# Patient Record
Sex: Male | Born: 2000 | Race: Black or African American | Hispanic: No | Marital: Single | Smoking: Never smoker
Health system: Southern US, Community
[De-identification: ages and names within clinical notes are randomized; demographics above are authoritative.]

## PROBLEM LIST (undated history)

## (undated) DIAGNOSIS — F988 Other specified behavioral and emotional disorders with onset usually occurring in childhood and adolescence: Secondary | ICD-10-CM

## (undated) DIAGNOSIS — G4730 Sleep apnea, unspecified: Secondary | ICD-10-CM

## (undated) DIAGNOSIS — F419 Anxiety disorder, unspecified: Secondary | ICD-10-CM

## (undated) DIAGNOSIS — S93316A Dislocation of tarsal joint of unspecified foot, initial encounter: Secondary | ICD-10-CM

## (undated) DIAGNOSIS — T7840XA Allergy, unspecified, initial encounter: Secondary | ICD-10-CM

## (undated) DIAGNOSIS — M6240 Contracture of muscle, unspecified site: Secondary | ICD-10-CM

## (undated) DIAGNOSIS — J45909 Unspecified asthma, uncomplicated: Secondary | ICD-10-CM

## (undated) HISTORY — PX: TONSILLECTOMY AND ADENOIDECTOMY: SHX28

## (undated) HISTORY — DX: Anxiety disorder, unspecified: F41.9

## (undated) HISTORY — DX: Allergy, unspecified, initial encounter: T78.40XA

## (undated) HISTORY — PX: APPENDECTOMY: SHX54

## (undated) HISTORY — DX: Sleep apnea, unspecified: G47.30

---
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
## 2020-01-05 ENCOUNTER — Emergency Department (HOSPITAL_COMMUNITY)
Admission: EM | Admit: 2020-01-05 | Discharge: 2020-01-05 | Disposition: A | Discharge status: Home / Self Care | Payer: Medicaid Other | Attending: Emergency Medicine | Admitting: Emergency Medicine

## 2020-01-05 ENCOUNTER — Emergency Department (HOSPITAL_COMMUNITY): Payer: Medicaid Other

## 2020-01-05 ENCOUNTER — Encounter (HOSPITAL_COMMUNITY): Payer: Self-pay | Admitting: Emergency Medicine

## 2020-01-05 ENCOUNTER — Other Ambulatory Visit: Payer: Self-pay

## 2020-01-05 DIAGNOSIS — Z7951 Long term (current) use of inhaled steroids: Secondary | ICD-10-CM | POA: Diagnosis not present

## 2020-01-05 DIAGNOSIS — M25571 Pain in right ankle and joints of right foot: Secondary | ICD-10-CM | POA: Diagnosis not present

## 2020-01-05 DIAGNOSIS — J45909 Unspecified asthma, uncomplicated: Secondary | ICD-10-CM | POA: Diagnosis not present

## 2020-01-05 NOTE — ED Triage Notes (Signed)
Pt reports R ankle swelling and pain that started tonight. Denies injury, but states that he had surgery on the same ankle over a year ago.

## 2020-01-05 NOTE — Discharge Instructions (Signed)
Tylenol or motrin for pain. Can wear ankle brace for now. If no improvement in the next few days,I would follow-up with your orthopedist. Return here for new concerns.

## 2020-01-05 NOTE — ED Provider Notes (Signed)
[facility]-EMERGENCY DEPT Provider Note   CSN: [id number] Arrival date & time: 01/05/20  0124     History Chief Complaint  Patient presents with  . Ankle Swelling    R    Stephen Bruce is a 19 y.o. male.  The history is provided by the patient and medical records.   19 year old male with history of ADD, developmental delay, asthma, presenting to the ED with right ankle pain.  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  Did have surgery a few years ago try to remedy this.  States his ankle began hurting him today, denies any new injury, trauma, or falls.  States it feels like it is swelling.  Pain worse with weightbearing and ambulation.  No intervention tried prior to arrival.  He is currently established with orthopedics.  Past Medical History:  Diagnosis Date  . Asthma    daily and prn inhalers  . Attention deficit disorder   . 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 ADHD (attention deficit hyperactivity disorder), combined type 07/22/2015  . 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 Learning disability 07/22/2015  . Delayed milestones 10/18/2010  . Speech delays 10/18/2010    Past Surgical History:  Procedure Laterality Date  . ACHILLES TENDON SURGERY Right 08/25/2013   Procedure: TENDON ACHILLES LENGTH RIGHT FOOT;  Surgeon: Alvan Dame, DPM;  Location: [facility];  Service: Podiatry;  Laterality: Right;  . ACHILLES TENDON SURGERY Left 02/04/2014   Procedure: TENDO ACHILLES LENGTH;  Surgeon: Alvan Dame, DPM;  Location: [facility];  Service: Podiatry;  Laterality: Left;  . INCISION AND DRAINAGE ABSCESS ANAL  01/15/2017      . SUBTALAR JOINT ARTHROEREISIS Right 08/25/2013   Procedure: SUBTALAR JOINT ARTHROEREISIS RIGHT;   Surgeon: Alvan Dame, DPM;  Location: [facility];  Service: Podiatry;  Laterality: Right;  . SUBTALAR JOINT ARTHROEREISIS Left 02/04/2014   Procedure: SUBTALAR  ARTHROEREISIS LEFT FOOT ;  Surgeon: Alvan Dame, DPM;  Location: [facility];  Service: Podiatry;  Laterality: Left;  . TONSILLECTOMY AND ADENOIDECTOMY         Family History  Problem Relation Age of Onset  . Diabetes Father   . Hypertension Father   . Asthma Father     Social History   Tobacco Use  . Smoking status: Never Smoker  . Smokeless tobacco: Never Used  Substance Use Topics  . Alcohol use: No  . Drug use: No    Home Medications Prior to Admission medications   Medication Sig Start Date End Date Taking? Authorizing Provider  Misc. Devices MISC New cpap machine on current settings and supplies.  DME: [employer] 12/25/19  Yes [provider]  albuterol (PROAIR HFA) 108 (90 Base) MCG/ACT inhaler Use 2 puffs every four hours as needed for cough or wheeze.  May use 2 puffs 10-20 minutes prior to exercise. 09/20/15   Alfonse Spruce, MD  atomoxetine (STRATTERA) 80 MG capsule Take 1 capsule (80 mg total) by mouth daily. Patient not taking: Reported on 09/20/2015 08/12/15   Nicholos Johns, NP  beclomethasone (QVAR) 80 MCG/ACT inhaler Use 2 puffs twice daily to prevent cough or wheeze.  Rinse gargle, and spit after use. 09/20/15   Alfonse Spruce, MD  clindamycin (CLEOCIN T) 1 % lotion Apply to face once daily for acne 07/15/15   [provider]  fluticasone (FLONASE) 50 MCG/ACT nasal spray USE 1-2 SPRAYS IN EACH NOSTRIL ONCE DAILY FOR STUFFY NOSE OR DRAINAGE. 09/21/15   Alfonse Spruce, MD  fluticasone (FLOVENT HFA) 110 MCG/ACT inhaler Inhale 2 puffs into the lungs 2 (two) times daily. 04/28/16   Alfonse Spruce, MD  fluticasone (FLOVENT HFA) 110 MCG/ACT inhaler Inhale 2 puffs into the lungs 2 (two) times daily. FOR COUGH AND WHEEZE 06/14/16    Alfonse Spruce, MD  ketoconazole (NIZORAL) 2 % cream Apply 1 application topically 2 (two) times daily. 06/05/19   Hyatt, Max T, DPM  loratadine (CLARITIN) 10 MG tablet TAKE 1 TABLET (10 MG TOTAL) BY MOUTH DAILY. 06/30/16   Alfonse Spruce, MD  polyethylene glycol powder [facility]) powder TAKE AS DIRECTED ONCE A DAY ORALLY 09/09/14   [provider]  Spacer/Aero-Holding Chambers (AEROCHAMBER PLUS) inhaler Use as instructed with MDI 10/14/15   Alfonse Spruce, MD  tretinoin (RETIN-A) 0.025 % cream Apply to face once daily for acne 07/15/15   [provider]    Allergies    Shellfish allergy and Shellfish-derived products  Review of Systems   Review of Systems  Musculoskeletal: Positive for arthralgias.  All other systems reviewed and are negative.   Physical Exam Updated Vital Signs BP (!) 161/86 (BP Location: Right Arm)   Pulse 67   Temp 98.3 F (36.8 C) (Oral)   Resp 16   SpO2 98%   Physical Exam Vitals and nursing note reviewed.  Constitutional:      Appearance: He is well-developed.  HENT:     Head: Normocephalic and atraumatic.  Eyes:     Conjunctiva/sclera: Conjunctivae normal.     Pupils: Pupils are equal, round, and reactive to light.  Cardiovascular:     Rate and Rhythm: Normal rate and regular rhythm.     Heart sounds: Normal heart sounds.  Pulmonary:     Effort: Pulmonary effort is normal.     Breath sounds: Normal breath sounds.  Abdominal:     General: Bowel sounds are normal.     Palpations: Abdomen is soft.  Musculoskeletal:        General: Normal range of motion.     Cervical back: Normal range of motion.     Comments: Ankle grossly normal in appearance, trace amount of swelling along lateral aspect; no bony deformity or overlying skin changes, DP pulse intact, moving toes normally, good distal sensation and cap refill  Skin:    General: Skin is warm and dry.  Neurological:     Mental Status: He is alert and  oriented to person, place, and time.     ED Results / Procedures / Treatments   Labs (all labs ordered are listed, but only abnormal results are displayed) Labs Reviewed - No data to display  EKG None  Radiology DG Ankle Complete Right  Result Date: 01/05/2020 CLINICAL DATA:  Right ankle pain EXAM: RIGHT ANKLE - COMPLETE 3+ VIEW COMPARISON:  None. FINDINGS: There is no evidence of fracture, dislocation. Small anterior ankle effusion. Unchanged appearance of surgical screw. There is no evidence of arthropathy or other focal bone abnormality. Soft tissues are unremarkable. IMPRESSION: Small anterior ankle effusion. No fracture or dislocation of the right ankle. Electronically Signed   By: Deatra Robinson M.D.   On: 01/05/2020 02:06    Procedures Procedures (including critical care time)  Medications  Ordered in ED Medications - No data to display  ED Course  I have reviewed the triage vital signs and the nursing notes.  Pertinent labs & imaging results that were available during my care of the patient were reviewed by me and considered in my medical decision making (see chart for details).    MDM Rules/Calculators/A&P    19 y.o. M here with right ankle pain. 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  No gross deformity on exam, mild swelling along lateral aspect.  Foot is NVI.  X-ray with small effusion.  ASO applied, RICE routine encouraged. Will follow-up with orthopedics if any ongoing issues.  Return here for any new/acute changes.  Final Clinical Impression(s) / ED Diagnoses Final diagnoses:  Acute right ankle pain    Rx / DC Orders ED Discharge Orders    None       Garlon Hatchet, PA-C 01/05/20 0251    Paula Libra, MD 01/05/20 [phone]

## 2020-04-20 DIAGNOSIS — Z1331 Encounter for screening for depression: Secondary | ICD-10-CM | POA: Diagnosis not present

## 2020-04-20 DIAGNOSIS — Z0000 Encounter for general adult medical examination without abnormal findings: Secondary | ICD-10-CM | POA: Diagnosis not present

## 2020-04-20 DIAGNOSIS — Z23 Encounter for immunization: Secondary | ICD-10-CM | POA: Diagnosis not present

## 2020-04-20 DIAGNOSIS — Z113 Encounter for screening for infections with a predominantly sexual mode of transmission: Secondary | ICD-10-CM | POA: Diagnosis not present

## 2020-04-20 DIAGNOSIS — G4700 Insomnia, unspecified: Secondary | ICD-10-CM | POA: Diagnosis not present

## 2020-04-20 DIAGNOSIS — F901 Attention-deficit hyperactivity disorder, predominantly hyperactive type: Secondary | ICD-10-CM | POA: Diagnosis not present

## 2020-04-21 DIAGNOSIS — G4721 Circadian rhythm sleep disorder, delayed sleep phase type: Secondary | ICD-10-CM | POA: Diagnosis not present

## 2020-04-21 DIAGNOSIS — R5383 Other fatigue: Secondary | ICD-10-CM | POA: Diagnosis not present

## 2020-04-21 DIAGNOSIS — Z9989 Dependence on other enabling machines and devices: Secondary | ICD-10-CM | POA: Diagnosis not present

## 2020-04-21 DIAGNOSIS — G4733 Obstructive sleep apnea (adult) (pediatric): Secondary | ICD-10-CM | POA: Diagnosis not present

## 2020-04-21 DIAGNOSIS — Z72821 Inadequate sleep hygiene: Secondary | ICD-10-CM | POA: Diagnosis not present

## 2020-09-16 DIAGNOSIS — G4733 Obstructive sleep apnea (adult) (pediatric): Secondary | ICD-10-CM | POA: Diagnosis not present

## 2020-12-25 ENCOUNTER — Encounter (HOSPITAL_COMMUNITY): Payer: Self-pay

## 2020-12-25 ENCOUNTER — Other Ambulatory Visit: Payer: Self-pay

## 2020-12-25 ENCOUNTER — Ambulatory Visit (HOSPITAL_COMMUNITY)
Admission: EM | Admit: 2020-12-25 | Discharge: 2020-12-25 | Disposition: A | Discharge status: Home / Self Care | Payer: Medicaid Other | Attending: Family Medicine | Admitting: Family Medicine

## 2020-12-25 DIAGNOSIS — J069 Acute upper respiratory infection, unspecified: Secondary | ICD-10-CM

## 2020-12-25 DIAGNOSIS — Z20822 Contact with and (suspected) exposure to covid-19: Secondary | ICD-10-CM | POA: Insufficient documentation

## 2020-12-25 MED ORDER — PROMETHAZINE-DM 6.25-15 MG/5ML PO SYRP: 5.0000 mL | ORAL_SOLUTION | Freq: Four times a day (QID) | ORAL | 0 refills | Status: DC | PRN

## 2020-12-25 NOTE — ED Provider Notes (Signed)
[facility]    CSN: [id number] Arrival date & time: 12/25/20  1430      History   Chief Complaint Chief Complaint  Patient presents with   Nasal Congestion    HPI Stephen Bruce is a 20 y.o. male.   Patient presenting today with mom for evaluation of 3-day history of runny nose, sore throat, cough, fatigue.  He denies chest tightness, shortness of breath, chest pain, body aches, fever, chills, abdominal pain, nausea vomiting or diarrhea.  Has not tried anything over-the-counter for symptoms but is compliant with his asthma and allergy regimen.  No known sick contacts recently.   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   Learning disability 07/22/2015   Delayed milestones 10/18/2010   Speech delays 10/18/2010    Past Surgical History:  Procedure Laterality Date   ACHILLES TENDON SURGERY Right 08/25/2013   Procedure: TENDON ACHILLES LENGTH RIGHT FOOT;  Surgeon: Alvan Dame, DPM;  Location: [facility];  Service: Podiatry;  Laterality: Right;   ACHILLES TENDON SURGERY Left 02/04/2014   Procedure: TENDO ACHILLES LENGTH;  Surgeon: Alvan Dame, DPM;  Location: [facility];  Service: Podiatry;  Laterality: Left;   INCISION AND DRAINAGE ABSCESS ANAL  01/15/2017       SUBTALAR JOINT ARTHROEREISIS Right 08/25/2013   Procedure: SUBTALAR JOINT ARTHROEREISIS RIGHT;  Surgeon: Alvan Dame, DPM;  Location: [facility];  Service: Podiatry;  Laterality: Right;   SUBTALAR JOINT ARTHROEREISIS Left 02/04/2014   Procedure: SUBTALAR  ARTHROEREISIS LEFT FOOT ;  Surgeon: Alvan Dame, DPM;  Location: [facility];  Service: Podiatry;  Laterality: Left;   TONSILLECTOMY AND ADENOIDECTOMY         Home Medications    Prior to Admission medications   Medication Sig Start Date End Date Taking? Authorizing Provider  promethazine-dextromethorphan (PROMETHAZINE-DM) 6.25-15 MG/5ML syrup Take 5 mLs by mouth 4 (four) times daily as needed for cough. 12/25/20  Yes Particia Nearing, PA-C  albuterol [facility]) [phone] Base) MCG/ACT inhaler Use 2 puffs every four hours as needed for cough or wheeze.  May use 2 puffs 10-20 minutes prior to exercise. 09/20/15   Alfonse Spruce, MD  atomoxetine (STRATTERA) 80 MG capsule Take 1 capsule (80 mg total) by mouth daily. Patient not taking: Reported on 09/20/2015 08/12/15   Nicholos Johns, NP  beclomethasone (QVAR) 80 MCG/ACT inhaler Use 2 puffs twice daily to prevent cough or wheeze.  Rinse gargle, and spit after use. 09/20/15   Alfonse Spruce, MD  clindamycin (CLEOCIN T) 1 % lotion Apply to face once daily for acne 07/15/15   [provider]  fluticasone (FLONASE) 50 MCG/ACT nasal spray USE 1-2 SPRAYS IN EACH NOSTRIL ONCE DAILY FOR STUFFY NOSE OR DRAINAGE. 09/21/15   Alfonse Spruce, MD  fluticasone (FLOVENT HFA) 110 MCG/ACT inhaler Inhale 2 puffs into the lungs 2 (two) times daily. 04/28/16   Alfonse Spruce, MD  fluticasone (FLOVENT HFA) 110 MCG/ACT inhaler Inhale 2 puffs into the lungs 2 (two) times daily. FOR COUGH AND WHEEZE 06/14/16  Alfonse Spruce, MD  ketoconazole (NIZORAL) 2 % cream Apply 1 application topically 2 (two) times daily. 06/05/19   Hyatt, Max T, DPM  loratadine (CLARITIN) 10 MG tablet TAKE 1 TABLET (10 MG TOTAL) BY MOUTH DAILY. 06/30/16   Alfonse Spruce, MD  Misc. Devices MISC New cpap machine on current settings and supplies.  DME: [employer] 12/25/19   [provider]  polyethylene glycol powder (GLYCOLAX/MIRALAX) powder TAKE AS DIRECTED ONCE A DAY  ORALLY 09/09/14   [provider]  Spacer/Aero-Holding Chambers (AEROCHAMBER PLUS) inhaler Use as instructed with MDI 10/14/15   Alfonse Spruce, MD  tretinoin (RETIN-A) 0.025 % cream Apply to face once daily for acne 07/15/15   [provider]    Family History Family History  Problem Relation Age of Onset   Diabetes Father    Hypertension Father    Asthma Father     Social History Social History   Tobacco Use   Smoking status: Never   Smokeless tobacco: Never  Substance Use Topics   Alcohol use: No   Drug use: No     Allergies   Shellfish allergy and Shellfish-derived products   Review of Systems Review of Systems Per HPI  Physical Exam Triage Vital Signs ED Triage Vitals  Enc Vitals Group     BP 12/25/20 1610 133/86     Pulse Rate 12/25/20 1610 80     Resp 12/25/20 1610 18     Temp 12/25/20 1610 98.5 F (36.9 C)     Temp Source 12/25/20 1610 Oral     SpO2 12/25/20 1610 97 %     Weight --      Height --      Head Circumference --      Peak Flow --      Pain Score 12/25/20 1609 0     Pain Loc --      Pain Edu? --      Excl. in GC? --    No data found.  Updated Vital Signs BP 133/86 (BP Location: Right Arm)   Pulse 80   Temp 98.5 F (36.9 C) (Oral)   Resp 18   SpO2 97%   Visual Acuity Right Eye Distance:   Left Eye Distance:   Bilateral Distance:    Right Eye Near:   Left Eye Near:    Bilateral Near:     Physical Exam Vitals and nursing note reviewed.  Constitutional:      Appearance: Normal appearance.  HENT:     Head: Atraumatic.     Right Ear: Tympanic membrane normal.     Left Ear: Tympanic membrane normal.     Nose: Rhinorrhea present.     Mouth/Throat:     Mouth: Mucous membranes are moist.     Pharynx: Oropharynx is clear. Posterior oropharyngeal erythema present. No oropharyngeal exudate.  Eyes:     Extraocular Movements: Extraocular movements intact.     Conjunctiva/sclera: Conjunctivae normal.   Cardiovascular:     Rate and Rhythm: Normal rate and regular rhythm.  Pulmonary:     Effort: Pulmonary effort is normal. No respiratory distress.     Breath sounds: Normal breath sounds. No wheezing or rales.  Musculoskeletal:        General: Normal range of motion.     Cervical back: Normal range of motion and neck supple.  Skin:    General: Skin is warm and dry.  Neurological:     General: No focal deficit  present.     Mental Status: Mental status is at baseline.  Psychiatric:        Mood and Affect: Mood normal.        Thought Content: Thought content normal.        Judgment: Judgment normal.     UC Treatments / Results  Labs (all labs ordered are listed, but only abnormal results are displayed) Labs Reviewed  SARS CORONAVIRUS 2 (TAT 6-24 HRS)  RESPIRATORY PANEL BY PCR    EKG   Radiology No results found.  Procedures Procedures (including critical care time)  Medications Ordered in UC Medications - No data to display  Initial Impression / Assessment and Plan / UC Course  I have reviewed the triage vital signs and the nursing notes.  Pertinent labs & imaging results that were available during my care of the patient were reviewed by me and considered in my medical decision making (see chart for details).     Vitals and exam reassuring, suspect viral illness causing symptoms.  COVID and respiratory panel pending, discussed Phenergan DM, DayQuil, NyQuil, continued asthma and allergy regimen.  Return for acutely worsening symptoms.  Final Clinical Impressions(s) / UC Diagnoses   Final diagnoses:  Viral URI with cough   Discharge Instructions   None    ED Prescriptions     Medication Sig Dispense Auth. Provider   promethazine-dextromethorphan (PROMETHAZINE-DM) 6.25-15 MG/5ML syrup Take 5 mLs by mouth 4 (four) times daily as needed for cough. 100 mL Particia Nearing, [place]      PDMP not reviewed this encounter.   Particia Nearing,  [place] 12/25/20 1639

## 2020-12-25 NOTE — ED Triage Notes (Signed)
Pt presents with runny nose x 3 days. States his nose has been bleeding and c/o cough.

## 2020-12-26 LAB — SARS CORONAVIRUS 2 (TAT 6-24 HRS): SARS Coronavirus 2: NEGATIVE

## 2020-12-28 DIAGNOSIS — J069 Acute upper respiratory infection, unspecified: Secondary | ICD-10-CM | POA: Diagnosis not present

## 2020-12-29 LAB — RESPIRATORY PANEL BY PCR

## 2021-04-21 DIAGNOSIS — G4733 Obstructive sleep apnea (adult) (pediatric): Secondary | ICD-10-CM | POA: Diagnosis not present

## 2021-06-28 DIAGNOSIS — Z6828 Body mass index (BMI) 28.0-28.9, adult: Secondary | ICD-10-CM | POA: Diagnosis not present

## 2021-06-28 DIAGNOSIS — Z0000 Encounter for general adult medical examination without abnormal findings: Secondary | ICD-10-CM | POA: Diagnosis not present

## 2021-06-28 DIAGNOSIS — Z113 Encounter for screening for infections with a predominantly sexual mode of transmission: Secondary | ICD-10-CM | POA: Diagnosis not present

## 2021-06-28 DIAGNOSIS — Z1331 Encounter for screening for depression: Secondary | ICD-10-CM | POA: Diagnosis not present

## 2021-06-28 DIAGNOSIS — Z713 Dietary counseling and surveillance: Secondary | ICD-10-CM | POA: Diagnosis not present

## 2021-07-08 DIAGNOSIS — G4733 Obstructive sleep apnea (adult) (pediatric): Secondary | ICD-10-CM | POA: Diagnosis not present

## 2021-07-13 DIAGNOSIS — L0889 Other specified local infections of the skin and subcutaneous tissue: Secondary | ICD-10-CM | POA: Diagnosis not present

## 2021-08-25 DIAGNOSIS — G4733 Obstructive sleep apnea (adult) (pediatric): Secondary | ICD-10-CM | POA: Diagnosis not present

## 2022-01-02 IMAGING — CR DG ANKLE COMPLETE 3+V*R*
3 series · 3 of 3 positions shown · non-contrast
Comparison: None.

CLINICAL DATA: Right ankle pain

EXAM:
RIGHT ANKLE - COMPLETE 3+ VIEW

[x ankle ap right]
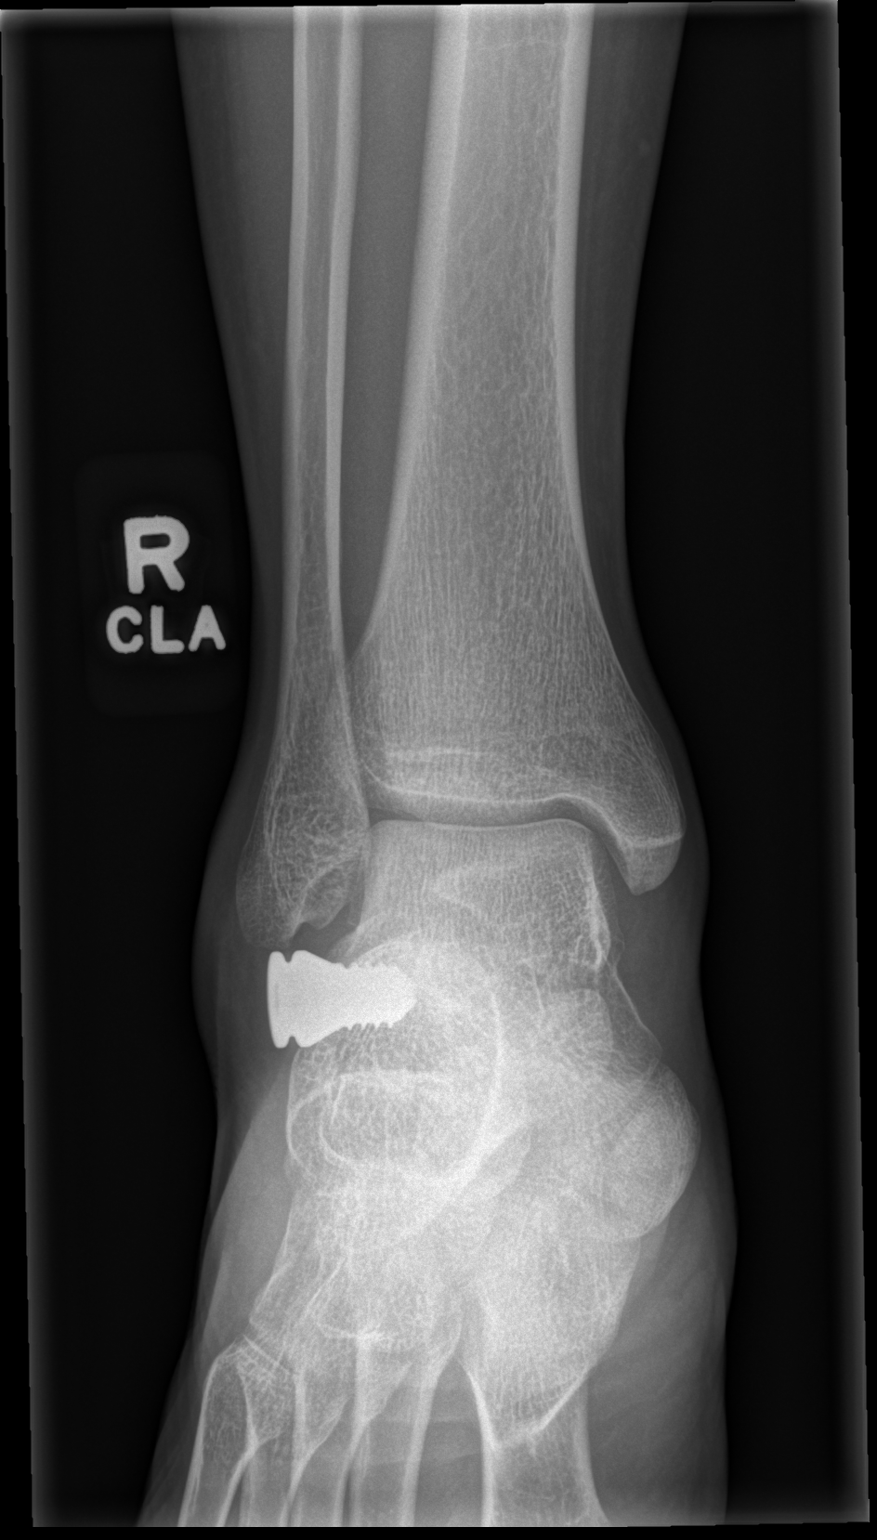

[x ankle obl right]
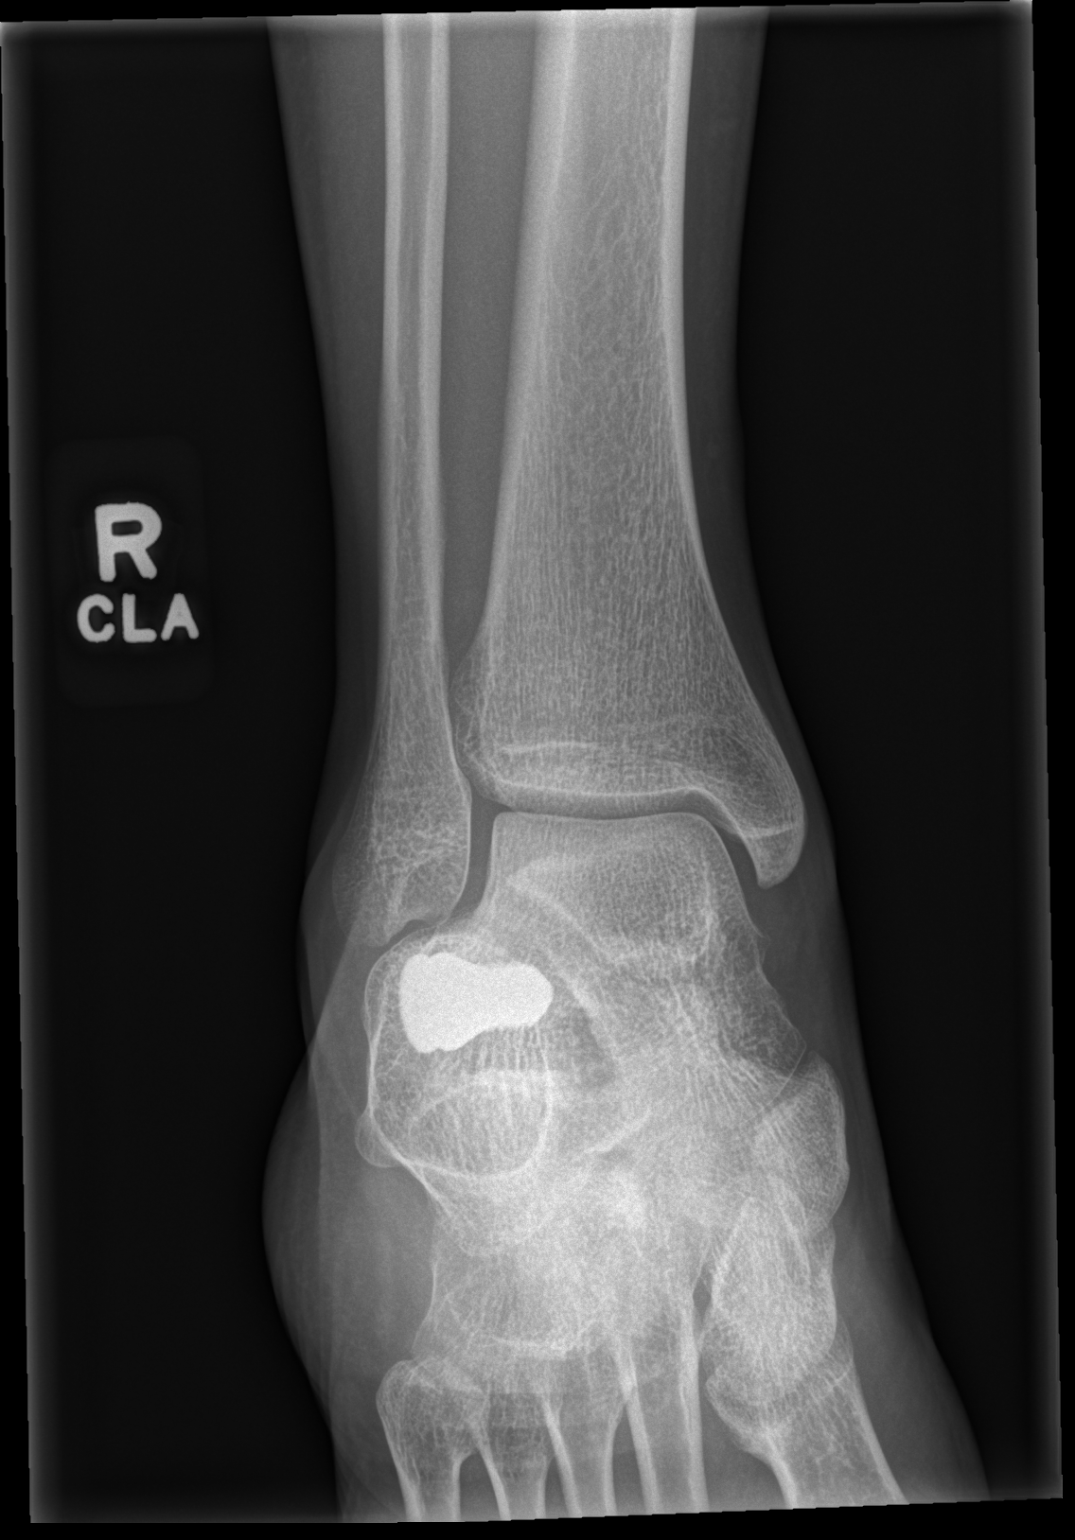

[x ankle lat right]
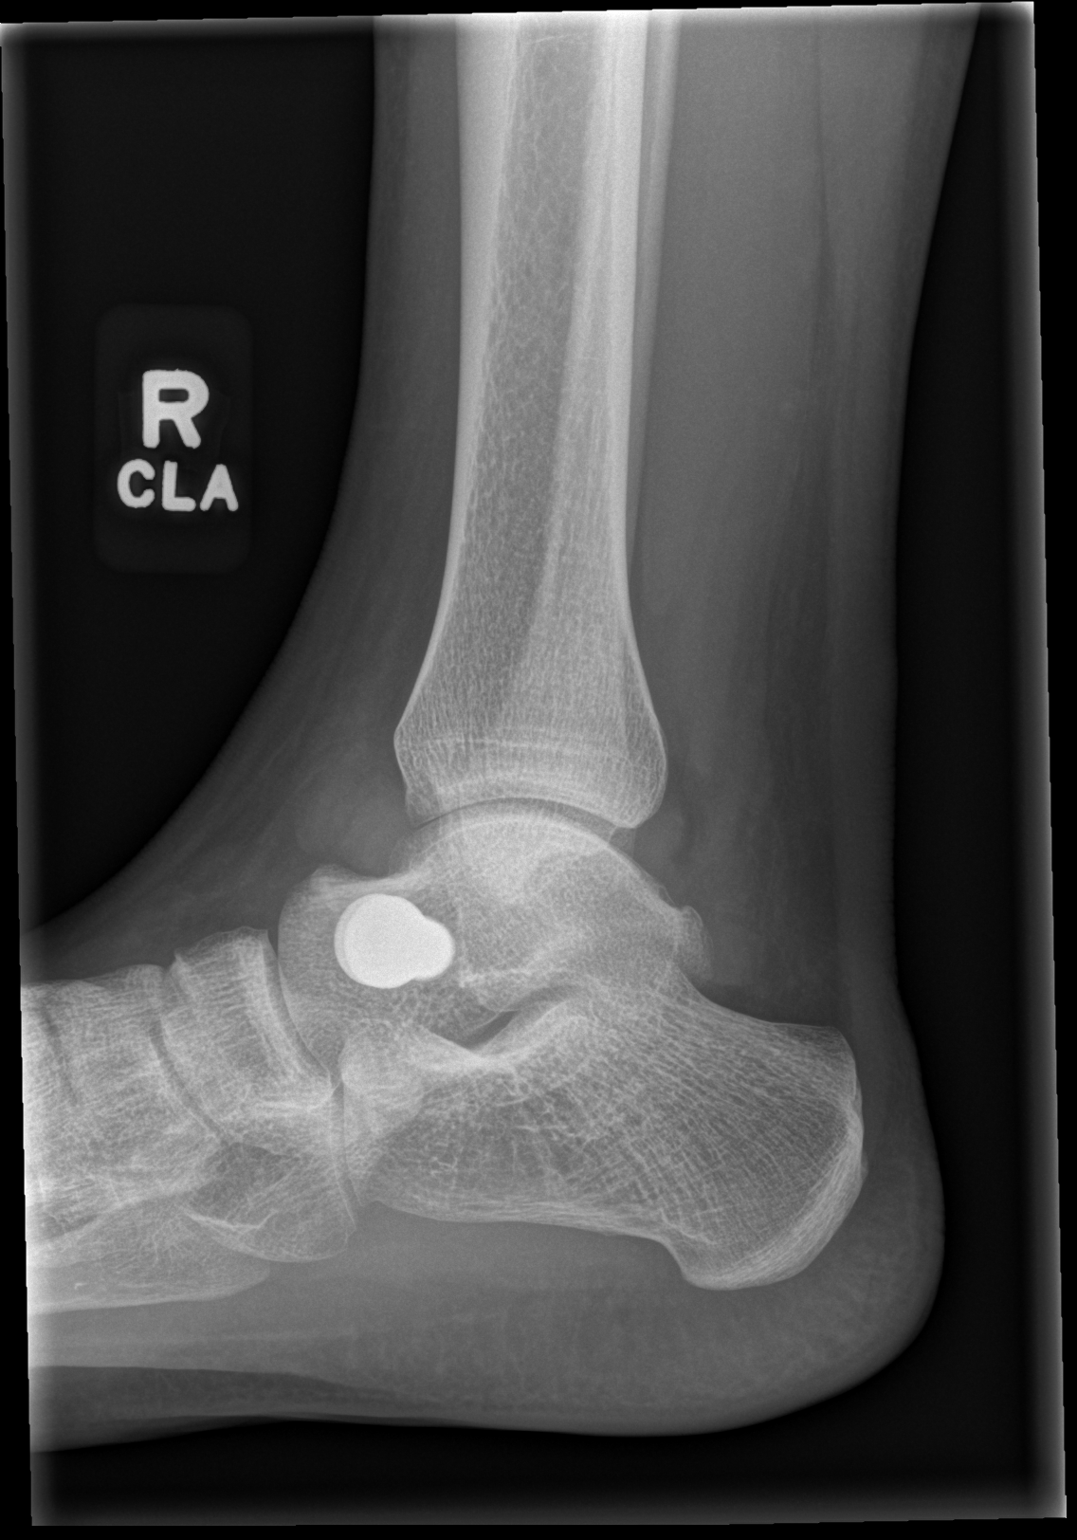

[3 of 3 positions shown; findings below may reference images not displayed]

FINDINGS: There is no evidence of fracture, dislocation. Small anterior ankle
effusion. Unchanged appearance of surgical screw. There is no
evidence of arthropathy or other focal bone abnormality. Soft
tissues are unremarkable.
IMPRESSION: Small anterior ankle effusion. No fracture or dislocation of the
right ankle.

## 2022-01-04 DIAGNOSIS — G4733 Obstructive sleep apnea (adult) (pediatric): Secondary | ICD-10-CM | POA: Diagnosis not present

## 2022-01-31 ENCOUNTER — Ambulatory Visit (HOSPITAL_COMMUNITY)
Admission: EM | Admit: 2022-01-31 | Discharge: 2022-01-31 | Disposition: A | Discharge status: Home / Self Care | Payer: Medicaid Other | Attending: Emergency Medicine | Admitting: Emergency Medicine

## 2022-01-31 ENCOUNTER — Encounter (HOSPITAL_COMMUNITY): Payer: Self-pay

## 2022-01-31 DIAGNOSIS — B349 Viral infection, unspecified: Secondary | ICD-10-CM | POA: Diagnosis not present

## 2022-01-31 DIAGNOSIS — Z1152 Encounter for screening for COVID-19: Secondary | ICD-10-CM | POA: Diagnosis not present

## 2022-01-31 LAB — RESP PANEL BY RT-PCR (FLU A&B, COVID) ARPGX2
Influenza A by PCR: NEGATIVE
Influenza B by PCR: NEGATIVE
SARS Coronavirus 2 by RT PCR: NEGATIVE

## 2022-01-31 MED ORDER — IBUPROFEN 600 MG PO TABS: 600.0000 mg | ORAL_TABLET | Freq: Four times a day (QID) | ORAL | 0 refills | Status: AC | PRN

## 2022-01-31 MED ORDER — GUAIFENESIN ER 600 MG PO TB12: 600.0000 mg | ORAL_TABLET | Freq: Two times a day (BID) | ORAL | 0 refills | Status: AC

## 2022-01-31 NOTE — ED Triage Notes (Signed)
Pt c/o headache/cough/runny nose/chills x4 days. States taking OTC meds with little relief.

## 2022-01-31 NOTE — ED Provider Notes (Signed)
[facility]    CSN: [id number] Arrival date & time: 01/31/22  1605     History   Chief Complaint Chief Complaint  Patient presents with   Cough    HPI Stephen Bruce is a 21 y.o. male.  Reports 4 day history of runny nose and congestion, body aches, some chills No fevers. No GI symptoms. 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   Learning disability 07/22/2015   Delayed milestones 10/18/2010   Speech delays 10/18/2010    Past Surgical History:  Procedure Laterality Date   ACHILLES TENDON SURGERY Right 08/25/2013   Procedure: TENDON ACHILLES LENGTH RIGHT FOOT;  Surgeon: Alvan Dame, DPM;  Location: [facility];  Service: Podiatry;  Laterality: Right;   ACHILLES TENDON SURGERY Left 02/04/2014   Procedure: TENDO ACHILLES LENGTH;  Surgeon: Alvan Dame, DPM;  Location: [facility];  Service: Podiatry;  Laterality: Left;   INCISION AND DRAINAGE ABSCESS ANAL  01/15/2017       SUBTALAR JOINT ARTHROEREISIS Right 08/25/2013   Procedure: SUBTALAR JOINT ARTHROEREISIS RIGHT;  Surgeon: Alvan Dame, DPM;  Location: [facility];  Service: Podiatry;  Laterality: Right;   SUBTALAR JOINT ARTHROEREISIS Left 02/04/2014   Procedure: SUBTALAR  ARTHROEREISIS LEFT FOOT ;  Surgeon: Alvan Dame, DPM;  Location: [facility];  Service: Podiatry;  Laterality: Left;   TONSILLECTOMY AND ADENOIDECTOMY      Home Medications    Prior to Admission medications    Medication Sig Start Date End Date Taking? Authorizing Provider  guaiFENesin (MUCINEX) 600 MG 12 hr tablet Take 1 tablet (600 mg total) by mouth 2 (two) times daily for 5 days. 01/31/22 02/05/22 Yes Tresia Revolorio, Lurena Joiner, PA-C  ibuprofen (ADVIL) 600 MG tablet Take 1 tablet (600 mg total) by mouth every 6 (six) hours as needed. 01/31/22  Yes Jsaon Yoo, Lurena Joiner, PA-C  albuterol (PROAIR HFA) 108 (90 Base) MCG/ACT inhaler Use 2 puffs every four hours as needed for cough or wheeze.  May use 2 puffs 10-20 minutes prior to exercise. 09/20/15   Alfonse Spruce, MD  atomoxetine (STRATTERA) 80 MG capsule Take 1 capsule (80 mg total) by mouth daily. Patient not taking: Reported on 09/20/2015 08/12/15   Nicholos Johns, NP  beclomethasone (QVAR) 80 MCG/ACT inhaler Use 2 puffs twice daily to prevent cough or wheeze.  Rinse gargle, and spit after use. 09/20/15   Alfonse Spruce, MD  clindamycin (CLEOCIN T) 1 % lotion Apply to face once daily for acne 07/15/15   [provider]  fluticasone (FLONASE) 50 MCG/ACT nasal spray USE 1-2 SPRAYS IN EACH NOSTRIL ONCE DAILY FOR STUFFY NOSE OR DRAINAGE. 09/21/15   Alfonse Spruce, MD  fluticasone (FLOVENT HFA) 110 MCG/ACT inhaler Inhale 2 puffs into the lungs 2 (two) times daily. 04/28/16   Alfonse Spruce, MD  fluticasone (FLOVENT HFA) 110 MCG/ACT inhaler Inhale 2 puffs into the lungs 2 (two) times daily. FOR COUGH AND WHEEZE 06/14/16  Valentina Shaggy, MD  ketoconazole (NIZORAL) 2 % cream Apply 1 application topically 2 (two) times daily. 06/05/19   Hyatt, Max T, DPM  loratadine (CLARITIN) 10 MG tablet TAKE 1 TABLET (10 MG TOTAL) BY MOUTH DAILY. 06/30/16   Valentina Shaggy, MD  Misc. Devices MISC New cpap machine on current settings and supplies.  DME: [facility] 12/25/19   [provider]  polyethylene glycol powder (GLYCOLAX/MIRALAX) powder TAKE AS DIRECTED ONCE A DAY ORALLY 09/09/14   [provider]   promethazine-dextromethorphan (PROMETHAZINE-DM) 6.25-15 MG/5ML syrup Take 5 mLs by mouth 4 (four) times daily as needed for cough. 12/25/20   Volney American, PA-C  Spacer/Aero-Holding Chambers (AEROCHAMBER PLUS) inhaler Use as instructed with MDI 10/14/15   Valentina Shaggy, MD  tretinoin (RETIN-A) 0.025 % cream Apply to face once daily for acne 07/15/15   [provider]    Family History Family History  Problem Relation Age of Onset   Diabetes Father    Hypertension Father    Asthma Father     Social History Social History   Tobacco Use   Smoking status: Never   Smokeless tobacco: Never  Substance Use Topics   Alcohol use: No   Drug use: No     Allergies   Shellfish allergy and Shellfish-derived products   Review of Systems Review of Systems  Per HPI  Physical Exam Triage Vital Signs ED Triage Vitals  Enc Vitals Group     BP      Pulse      Resp      Temp      Temp src      SpO2      Weight      Height      Head Circumference      Peak Flow      Pain Score      Pain Loc      Pain Edu?      Excl. in [place]?    No data found.  Updated Vital Signs BP 117/80 (BP Location: Left Arm)   Pulse 68   Temp 98.4 F (36.9 C) (Oral)   Resp 18   SpO2 98%    Physical Exam Vitals and nursing note reviewed.  Constitutional:      General: He is not in acute distress. HENT:     Nose: No rhinorrhea.     Mouth/Throat:     Mouth: Mucous membranes are moist.     Pharynx: Oropharynx is clear. Uvula midline. No posterior oropharyngeal erythema.     Tonsils: No tonsillar exudate or tonsillar abscesses.  Eyes:     Conjunctiva/sclera: Conjunctivae normal.  Cardiovascular:     Rate and Rhythm: Normal rate and regular rhythm.     Pulses: Normal pulses.     Heart sounds: Normal heart sounds.  Pulmonary:     Effort: Pulmonary effort is normal. No respiratory distress.     Breath sounds: Normal breath sounds.  Abdominal:     Palpations: Abdomen is  soft.     Tenderness: There is no abdominal tenderness. There is no guarding.  Musculoskeletal:     Cervical back: Normal range of motion.  Lymphadenopathy:     Cervical: No cervical adenopathy.  Skin:    General: Skin is warm and dry.  Neurological:     Mental Status: He is alert and oriented to person, place, and time.    UC Treatments / Results  Labs (all labs ordered are  listed, but only abnormal results are displayed) Labs Reviewed  RESP PANEL BY RT-PCR (FLU A&B, COVID) [id number]    EKG  Radiology No results found.  Procedures Procedures   Medications Ordered in UC Medications - No data to display  Initial Impression / Assessment and Plan / UC Course  I have reviewed the triage vital signs and the nursing notes.  Pertinent labs & imaging results that were available during my care of the patient were reviewed by me and considered in my medical decision making (see chart for details).  Afebrile here Likely viral illness Mom would like covid/flu test, currently pending. Discussed out of window for flu antivirals, if covid positive consider Paxlovid although he is not high risk. Patient preference based on results. No recent GFR but he is young and healthy. Symptomatic care - try mucinex for congestion, tylenol and ibuprofen alternated for aches/fever, increase fluids Work note provided Return precautions discussed. Patient and mom agree to plan  Final Clinical Impressions(s) / UC Diagnoses   Final diagnoses:  Viral illness     Discharge Instructions      You can alternate tylenol and ibuprofen every 4-6 hours for pain/fever Mucinex twice daily can help with congestion, if taken with lots of fluids! Symptoms should improve over the next several days.  We will call you if your covid/flu test returns positive.       ED Prescriptions     Medication Sig Dispense Auth. Provider   guaiFENesin (MUCINEX) 600 MG 12 hr tablet Take 1 tablet (600 mg total) by mouth  2 (two) times daily for 5 days. 10 tablet Drea Jurewicz, PA-C   ibuprofen (ADVIL) 600 MG tablet Take 1 tablet (600 mg total) by mouth every 6 (six) hours as needed. 30 tablet Libni Fusaro, Wells Guiles, PA-C      PDMP not reviewed this encounter.   Sakiya Stepka, Wells Guiles, Hershal Coria 01/31/22 2030

## 2022-01-31 NOTE — Discharge Instructions (Addendum)
You can alternate tylenol and ibuprofen every 4-6 hours for pain/fever Mucinex twice daily can help with congestion, if taken with lots of fluids! Symptoms should improve over the next several days.  We will call you if your covid/flu test returns positive.

## 2022-03-26 ENCOUNTER — Encounter (HOSPITAL_COMMUNITY): Payer: Self-pay | Admitting: *Deleted

## 2022-03-26 ENCOUNTER — Ambulatory Visit (HOSPITAL_COMMUNITY)
Admission: EM | Admit: 2022-03-26 | Discharge: 2022-03-26 | Disposition: A | Discharge status: Home / Self Care | Payer: Medicaid Other | Attending: Physician Assistant | Admitting: Physician Assistant

## 2022-03-26 DIAGNOSIS — L739 Follicular disorder, unspecified: Secondary | ICD-10-CM

## 2022-03-26 MED ORDER — DOXYCYCLINE HYCLATE 100 MG PO CAPS: 100.0000 mg | ORAL_CAPSULE | Freq: Two times a day (BID) | ORAL | 0 refills | Status: DC

## 2022-03-26 MED ORDER — CLINDAMYCIN PHOSPHATE 1 % EX LOTN: TOPICAL_LOTION | Freq: Two times a day (BID) | CUTANEOUS | 0 refills | Status: DC

## 2022-03-26 NOTE — Discharge Instructions (Addendum)
Internal referral has been made to [facility].  Their office should be calling you within the next 2 to 3 days to arrange appointment to be seen and evaluated.  Advised take doxycycline 100 mg every 12 hours to treat the scalp skin infection which is also referred to as a folliculitis.  Also use the lotion and apply to the irritated areas twice daily as this will also help resolve the irritation.  Advised follow-up PCP or return as needed.

## 2022-03-26 NOTE — ED Triage Notes (Signed)
Per mother, pt had same sxs last yr -- was placed on couple different abx and referred to dermatology, but never got an appt. C/O "sores to head", which cause small areas of alopecia; started with area of swelling to right scalp -- mother states she noticed swelling 2 days ago.

## 2022-03-26 NOTE — ED Provider Notes (Signed)
[facility]    CSN: [id number] Arrival date & time: 03/26/22  1337      History   Chief Complaint No chief complaint on file.   HPI Stephen Bruce is a 22 y.o. male.   22 year old male presents with sores in scalp.  Patient's mother indicates that over the past couple years he has been having intermittent sores in the scalp.  She relates that the sores will erupt in several different places, slightly painful, sometimes they will drain.  She relates when they resolve there is hair loss around the areas and it takes a couple months for the hair to grow back.  She indicates that last time this occurred was a year ago and he was placed on antibiotic and this tended to improve the condition.  She indicates he was given a referral to dermatology but she was not able to get and at that time to have them evaluated.  She indicates the worst area always on the right temporal side of the scalp to where there is a fairly large area that tends to erupt intermittently.  She indicates sometimes that area will tend to drain and then it resolves only to return.  She relates she has not had any trauma to the scalp, no fever or chills.  She relates she does use head and shoulder shampoo on a regular basis to try to prevent the areas from developing.     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   Learning disability 07/22/2015   Delayed milestones 10/18/2010   Speech delays 10/18/2010    Past Surgical History:  Procedure Laterality Date   ACHILLES TENDON SURGERY Right 08/25/2013   Procedure: TENDON ACHILLES LENGTH RIGHT FOOT;  Surgeon:  Harriet Masson, DPM;  Location: [facility];  Service: Podiatry;  Laterality: Right;   ACHILLES TENDON SURGERY Left 02/04/2014   Procedure: TENDO ACHILLES LENGTH;  Surgeon: Harriet Masson, DPM;  Location: [facility];  Service: Podiatry;  Laterality: Left;   INCISION AND DRAINAGE ABSCESS ANAL  01/15/2017       SUBTALAR JOINT ARTHROEREISIS Right 08/25/2013   Procedure: SUBTALAR JOINT ARTHROEREISIS RIGHT;  Surgeon: Harriet Masson, DPM;  Location: [facility];  Service: Podiatry;  Laterality: Right;   SUBTALAR JOINT ARTHROEREISIS Left 02/04/2014   Procedure: SUBTALAR  ARTHROEREISIS LEFT FOOT ;  Surgeon: Harriet Masson, DPM;  Location: [facility];  Service: Podiatry;  Laterality: Left;   TONSILLECTOMY AND ADENOIDECTOMY         Home Medications    Prior to Admission medications   Medication Sig Start Date End Date Taking? Authorizing Provider  clindamycin (CLEOCIN T) 1 % lotion Apply topically 2 (two) times daily. 03/26/22  Yes Nyoka Lint, PA-C  doxycycline (VIBRAMYCIN) 100 MG capsule Take 1 capsule (100 mg total) by mouth 2 (two) times daily. 03/26/22  Yes Nyoka Lint, PA-C  albuterol [facility]) [phone] Base) MCG/ACT inhaler Use 2 puffs every four hours as needed for cough or wheeze.  May use 2 puffs 10-20 minutes prior to exercise. 09/20/15   Salvatore Marvel  Louis, MD  atomoxetine (STRATTERA) 80 MG capsule Take 1 capsule (80 mg total) by mouth daily. Patient not taking: Reported on 09/20/2015 08/12/15   Gery Pray, NP  beclomethasone (QVAR) 80 MCG/ACT inhaler Use 2 puffs twice daily to prevent cough or wheeze.  Rinse gargle, and spit after use. 09/20/15   Valentina Shaggy, MD  clindamycin (CLEOCIN T) 1 % lotion Apply to face once daily for acne 07/15/15   [provider]  fluticasone (FLONASE) 50 MCG/ACT nasal spray USE 1-2 SPRAYS IN EACH NOSTRIL ONCE DAILY FOR STUFFY NOSE OR DRAINAGE. 09/21/15   Valentina Shaggy, MD   fluticasone (FLOVENT HFA) 110 MCG/ACT inhaler Inhale 2 puffs into the lungs 2 (two) times daily. 04/28/16   Valentina Shaggy, MD  fluticasone (FLOVENT HFA) 110 MCG/ACT inhaler Inhale 2 puffs into the lungs 2 (two) times daily. FOR COUGH AND WHEEZE 06/14/16   Valentina Shaggy, MD  ibuprofen (ADVIL) 600 MG tablet Take 1 tablet (600 mg total) by mouth every 6 (six) hours as needed. 01/31/22   Rising, Wells Guiles, PA-C  ketoconazole (NIZORAL) 2 % cream Apply 1 application topically 2 (two) times daily. 06/05/19   Hyatt, Max T, DPM  loratadine (CLARITIN) 10 MG tablet TAKE 1 TABLET (10 MG TOTAL) BY MOUTH DAILY. 06/30/16   Valentina Shaggy, MD  Misc. Devices MISC New cpap machine on current settings and supplies.  DME: [facility] 12/25/19   [provider]  polyethylene glycol powder (GLYCOLAX/MIRALAX) powder TAKE AS DIRECTED ONCE A DAY ORALLY 09/09/14   [provider]  promethazine-dextromethorphan (PROMETHAZINE-DM) 6.25-15 MG/5ML syrup Take 5 mLs by mouth 4 (four) times daily as needed for cough. 12/25/20   Volney American, PA-C  Spacer/Aero-Holding Chambers (AEROCHAMBER PLUS) inhaler Use as instructed with MDI 10/14/15   Valentina Shaggy, MD  tretinoin (RETIN-A) 0.025 % cream Apply to face once daily for acne 07/15/15   [provider]    Family History Family History  Problem Relation Age of Onset   Diabetes Father    Hypertension Father    Asthma Father     Social History Social History   Tobacco Use   Smoking status: Never   Smokeless tobacco: Never  Vaping Use   Vaping Use: Never used  Substance Use Topics   Alcohol use: No   Drug use: Never     Allergies   Shellfish allergy and Shellfish-derived products   Review of Systems Review of Systems  Skin:  Positive for rash (scalp sores, pustular eruptions).     Physical Exam Triage Vital Signs ED Triage Vitals  Enc Vitals Group     BP 03/26/22 1439 111/71     Pulse Rate  03/26/22 1439 77     Resp 03/26/22 1439 16     Temp 03/26/22 1439 98.2 F (36.8 C)     Temp Source 03/26/22 1439 Oral     SpO2 03/26/22 1439 97 %     Weight --      Height --      Head Circumference --      Peak Flow --      Pain Score 03/26/22 1440 0     Pain Loc --      Pain Edu? --      Excl. in [place]? --    No data found.  Updated Vital Signs BP 111/71   Pulse 77   Temp 98.2 F (36.8 C) (Oral)   Resp 16   SpO2  97%   Visual Acuity Right Eye Distance:   Left Eye Distance:   Bilateral Distance:    Right Eye Near:   Left Eye Near:    Bilateral Near:     Physical Exam Constitutional:      Appearance: Normal appearance.  Skin:         Comments: Scalp there are several areas on the posterior occipital portion of the scalp where there is resolving cystic folliculitis present, there is no active drainage but there is evidence of the healing pores.  Right temporal area has a 1 cm x 1 cm raised cystic formation without pointing, no drainage, no pain on palpation.  Neurological:     Mental Status: He is alert.      UC Treatments / Results  Labs (all labs ordered are listed, but only abnormal results are displayed) Labs Reviewed - No data to display  EKG   Radiology No results found.  Procedures Procedures (including critical care time)  Medications Ordered in UC Medications - No data to display  Initial Impression / Assessment and Plan / UC Course  I have reviewed the triage vital signs and the nursing notes.  Pertinent labs & imaging results that were available during my care of the patient were reviewed by me and considered in my medical decision making (see chart for details).    Plan: The diagnosis will be treated with the following: 1.  Cystic folliculitis of scalp: A.  Doxycycline 100 mg every 12 hours to treat infection. B.  Cleocin T lotion applied twice daily to the areas to treat infection. C.  Internal referral made to dermatology [place] for appointment to be made in the areas evaluated. 2.  Advised follow-up PCP or return to urgent care as needed.  Final Clinical Impressions(s) / UC Diagnoses   Final diagnoses:  Folliculitis     Discharge Instructions      Internal referral has been made to [facility].  Their office should be calling you within the next 2 to 3 days to arrange appointment to be seen and evaluated.  Advised take doxycycline 100 mg every 12 hours to treat the scalp skin infection which is also referred to as a folliculitis.  Also use the lotion and apply to the irritated areas twice daily as this will also help resolve the irritation.  Advised follow-up PCP or return as needed.    ED Prescriptions     Medication Sig Dispense Auth. Provider   doxycycline (VIBRAMYCIN) 100 MG capsule Take 1 capsule (100 mg total) by mouth 2 (two) times daily. 20 capsule Nyoka Lint, PA-C   clindamycin (CLEOCIN T) 1 % lotion Apply topically 2 (two) times daily. 60 mL Nyoka Lint, PA-C      PDMP not reviewed this encounter.   Nyoka Lint, PA-C 03/26/22 1519

## 2022-04-20 DIAGNOSIS — G4733 Obstructive sleep apnea (adult) (pediatric): Secondary | ICD-10-CM | POA: Diagnosis not present

## 2022-05-22 ENCOUNTER — Encounter: Payer: Self-pay | Admitting: Nurse Practitioner

## 2022-05-22 ENCOUNTER — Ambulatory Visit: Payer: Medicaid Other | Admitting: Nurse Practitioner

## 2022-05-22 VITALS — BP 98/58 | HR 72 | Temp 98.1°F | Ht 67.0 in | Wt 173.0 lb

## 2022-05-22 DIAGNOSIS — Z114 Encounter for screening for human immunodeficiency virus [HIV]: Secondary | ICD-10-CM | POA: Diagnosis not present

## 2022-05-22 DIAGNOSIS — L659 Nonscarring hair loss, unspecified: Secondary | ICD-10-CM | POA: Diagnosis not present

## 2022-05-22 DIAGNOSIS — Z1159 Encounter for screening for other viral diseases: Secondary | ICD-10-CM

## 2022-05-22 DIAGNOSIS — F819 Developmental disorder of scholastic skills, unspecified: Secondary | ICD-10-CM

## 2022-05-22 DIAGNOSIS — Z13220 Encounter for screening for lipoid disorders: Secondary | ICD-10-CM | POA: Diagnosis not present

## 2022-05-22 DIAGNOSIS — L03818 Cellulitis of other sites: Secondary | ICD-10-CM

## 2022-05-22 DIAGNOSIS — Z23 Encounter for immunization: Secondary | ICD-10-CM | POA: Diagnosis not present

## 2022-05-22 DIAGNOSIS — F809 Developmental disorder of speech and language, unspecified: Secondary | ICD-10-CM | POA: Diagnosis not present

## 2022-05-22 DIAGNOSIS — Z1321 Encounter for screening for nutritional disorder: Secondary | ICD-10-CM | POA: Diagnosis not present

## 2022-05-22 DIAGNOSIS — Z9989 Dependence on other enabling machines and devices: Secondary | ICD-10-CM | POA: Diagnosis not present

## 2022-05-22 DIAGNOSIS — G4733 Obstructive sleep apnea (adult) (pediatric): Secondary | ICD-10-CM

## 2022-05-22 MED ORDER — CEPHALEXIN 250 MG/5ML PO SUSR: 250.0000 mg | Freq: Four times a day (QID) | ORAL | 0 refills | Status: DC

## 2022-05-22 NOTE — Progress Notes (Signed)
I,[name],acting as a scribe for Stephen Felts, FNP.,have documented all relevant documentation on the behalf of ArnettNeurosurgeonelts, FNP,as directed by  Stephen Felts, FNP while in the presence of Stephen Felts, FNP.   Subjective:     Patient ID: Stephen Bruce , male    DOB: [birth year] , 22 y.o.   MRN: [id number]   Chief Complaint  Patient presents with   Establish Care    HPI  Patient presents today to establish care. He was going to [facility] pediatrics last seen May 2023 - he went in May due to hair loss. He is working at [employer]. He lives with is mother.   PMH - Asthma, ADHD, sleep apnea (CPAP use), ples planus, Learning disabilities unsure of the type. He has SCAT and has a job Psychologist, occupational to help. He does not drive. He is unable to travel on his own with a bus route.   He has a job Psychologist, occupational through [employer]". He is getting set up with psychiatry via Social Services. He was on ADHD he was removed due to causing an effect on his hormones.  Patient reports issues with chronic hair loss, has been on multiple different medications and OTC medicated shampoos with little improvement. Patient's mom reports it seems to come and go. Patient states the areas do not itch or burn, they used to be very red and sore. His hair tends to stay oily to the touch. He was treated with an antibiotic when he went to PCP improved and hair growth improvement. He was seen in ER and treated with docycyline. He was referred to a Dermatologist will be a year.   Patient has no other complaints or concerns. Patient was previously seen at [facility].   He drinks water about 3-4 bottles a day.      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 Devices MISC, New cpap machine on current settings and supplies.  DME: [facility], Disp: , Rfl:    fluticasone (FLONASE) 50 MCG/ACT nasal spray, USE 1-2 SPRAYS IN EACH NOSTRIL ONCE DAILY FOR STUFFY NOSE OR DRAINAGE. (Patient not taking: Reported on 05/22/2022), Disp: 16 g, Rfl: 5   Vitamin D, Ergocalciferol, (DRISDOL) 1.25 MG (50000 UNIT) CAPS capsule, Take 1 capsule (50,000 Units total) by mouth 2 (two) times a week., Disp: 24 capsule, Rfl: 1   Allergies  Allergen Reactions   Shellfish Allergy Other (See Comments)    POSITIVE ON ALLERGY TESTING   Shellfish-Derived Products Other (See Comments)    POSITIVE ON ALLERGY TESTING    Review of Systems  Constitutional: Negative.   Respiratory: Negative.    Psychiatric/Behavioral: Negative.  Today's Vitals   05/22/22 1532  BP: (!) 98/58  Pulse: 72  Temp: 98.1 F (36.7 C)  TempSrc: Oral  SpO2: 97%  Weight: 173 lb (78.5 kg)  Height: 5\' 7"  (1.702 m)   Body mass index is 27.1 kg/m.   Objective:  Physical Exam Vitals reviewed.  Constitutional:      General: He is not in acute distress.    Appearance: Normal appearance.  HENT:     Head: Normocephalic.  Cardiovascular:     Rate and Rhythm: Normal rate and regular rhythm.     Pulses: Normal pulses.     Heart sounds: Normal heart sounds. No murmur heard. Pulmonary:     Effort: Pulmonary effort is normal. No respiratory distress.     Breath sounds: Normal breath sounds. No wheezing.  Musculoskeletal:        General: Normal range of motion.  Skin:    General:  Skin is warm and dry.     Capillary Refill: Capillary refill takes less than 2 seconds.  Neurological:     General: No focal deficit present.     Mental Status: He is alert and oriented to person, place, and time.  Psychiatric:        Mood and Affect: Mood normal.        Behavior: Behavior normal.        Thought Content: Thought content normal.        Judgment: Judgment normal.         Assessment And Plan:    1. Hair loss Comments: He has balding to the crown of his head, will check for metabolic causes. will also refer to Dermatology - Ambulatory referral to Dermatology - CBC - CMP14+EGFR - VITAMIN D 25 Hydroxy (Vit-D Deficiency, Fractures) - TSH  2. Learning disability Comments: He is able to work in an environment but not able to go on a bus route. Will refer to CCM to assist with any resources. - AMB Referral to Managed Medicaid Care Management  3. Speech delays - AMB Referral to Managed Medicaid Care Management  4. Cellulitis of other specified site Comments: Has some erythema to crown of head, will treat for cellulitis while awaiting visit to Dermatolog  5. Need for Tdap vaccination Will give tetanus vaccine today while in office. Refer to order management. TDAP will be administered to adults 8-53 years old every 10 years. - Tdap vaccine greater than or equal to 7yo IM  6. Encounter for hepatitis C screening test for low risk patient Will check Hepatitis C screening due to recent recommendations to screen all adults 18 years and older - Hepatitis C antibody  7. Encounter for HIV (human immunodeficiency virus) test - HIV Antibody (routine testing w rflx)  8. Obstructive sleep apnea (adult) (pediatric) Comments: Managed by the lung specialist.  9. CPAP (continuous positive airway pressure) dependence Comments: Mother reports he is doing well with his CPAP  10. Encounter for screening for lipid disorder - Lipid panel  11. Encounter for vitamin deficiency  screening - VITAMIN D 25 Hydroxy (Vit-D Deficiency, Fractures)   Patient was given opportunity to ask questions. Patient verbalized understanding of the plan and was able to repeat key elements of the plan. All questions were answered to their satisfaction.   Stephen Felts, FNP   I, Stephen Felts, FNP, have reviewed all documentation for this visit. The documentation on 05/22/22 for the exam, diagnosis, procedures, and orders are all accurate and complete.   THE PATIENT IS ENCOURAGED  TO PRACTICE SOCIAL DISTANCING DUE TO THE COVID-19 PANDEMIC.

## 2022-05-22 NOTE — Patient Instructions (Signed)
This is the number to for the [place] for the [employer] [phone]

## 2022-05-23 ENCOUNTER — Other Ambulatory Visit: Payer: Self-pay | Admitting: Nurse Practitioner

## 2022-05-23 DIAGNOSIS — E559 Vitamin D deficiency, unspecified: Secondary | ICD-10-CM

## 2022-05-23 LAB — CBC
Hematocrit: 40.5 % (ref 37.5–51.0)
Hemoglobin: 13.8 g/dL (ref 13.0–17.7)
MCH: 30.2 pg (ref 26.6–33.0)
MCHC: 34.1 g/dL (ref 31.5–35.7)
MCV: 89 fL (ref 79–97)
Platelets: 164 10*3/uL (ref 150–450)
RBC: 4.57 x10E6/uL (ref 4.14–5.80)
RDW: 11.8 % (ref 11.6–15.4)
WBC: 5.7 10*3/uL (ref 3.4–10.8)

## 2022-05-23 LAB — TSH: TSH: 1.52 u[IU]/mL (ref 0.450–4.500)

## 2022-05-23 LAB — CMP14+EGFR
ALT: 17 IU/L (ref 0–44)
AST: 15 IU/L (ref 0–40)
Albumin/Globulin Ratio: 1.8 (ref 1.2–2.2)
Albumin: 4.5 g/dL (ref 4.3–5.2)
Alkaline Phosphatase: 70 IU/L (ref 44–121)
BUN/Creatinine Ratio: 13 (ref 9–20)
BUN: 11 mg/dL (ref 6–20)
Bilirubin Total: 0.7 mg/dL (ref 0.0–1.2)
CO2: 26 mmol/L (ref 20–29)
Calcium: 9.6 mg/dL (ref 8.7–10.2)
Chloride: 100 mmol/L (ref 96–106)
Creatinine, Ser: 0.88 mg/dL (ref 0.76–1.27)
Globulin, Total: 2.5 g/dL (ref 1.5–4.5)
Glucose: 83 mg/dL (ref 70–99)
Potassium: 4.1 mmol/L (ref 3.5–5.2)
Sodium: 138 mmol/L (ref 134–144)
Total Protein: 7 g/dL (ref 6.0–8.5)
eGFR: 125 mL/min/{1.73_m2} (ref >59)

## 2022-05-23 LAB — LIPID PANEL
Chol/HDL Ratio: 2.6 ratio (ref 0.0–5.0)
Cholesterol, Total: 138 mg/dL (ref 100–199)
HDL: 54 mg/dL (ref >39)
LDL Chol Calc (NIH): 75 mg/dL (ref 0–99)
Triglycerides: 39 mg/dL (ref 0–149)
VLDL Cholesterol Cal: 9 mg/dL (ref 5–40)

## 2022-05-23 LAB — HEPATITIS C ANTIBODY: Hep C Virus Ab: NONREACTIVE

## 2022-05-23 LAB — VITAMIN D 25 HYDROXY (VIT D DEFICIENCY, FRACTURES): Vit D, 25-Hydroxy: 23.8 ng/mL — ABNORMAL LOW (ref 30.0–100.0)

## 2022-05-23 LAB — HIV ANTIBODY (ROUTINE TESTING W REFLEX): HIV Screen 4th Generation wRfx: NONREACTIVE

## 2022-05-23 MED ORDER — VITAMIN D (ERGOCALCIFEROL) 1.25 MG (50000 UNIT) PO CAPS: 50000.0000 [IU] | ORAL_CAPSULE | ORAL | 1 refills | Status: DC

## 2022-05-25 ENCOUNTER — Other Ambulatory Visit: Payer: Medicaid Other

## 2022-05-25 NOTE — Patient Outreach (Addendum)
Error

## 2022-05-25 NOTE — Patient Instructions (Addendum)
Medicaid Managed Care   Unsuccessful Outreach Note  05/25/2022 Name: Stephen Bruce MRN: [id number] DOB: 2000/10/07  Referred by: Minette Brine, FNP Reason for referral : High Risk Managed Medicaid (MM social work unsuccessful telephone outreach )   An unsuccessful telephone outreach was attempted today. The patient was referred to the case management team for assistance with care management and care coordination.   Follow Up Plan: The care management team will reach out to the patient again over the next 7-10 days.   Mickel Fuchs, Arita Miss, [place] Medicaid Social Worker [phone] Visit Information  Stephen Bruce was given information about Medicaid Managed Care team care coordination services as a part of their Healthy [facility] Medicaid benefit. Stephen Bruce verbally consented to engagement with the [facility] Managed Care team.   If you are experiencing a medical emergency, please call 911 or report to your local emergency department or urgent care.   If you have a non-emergency medical problem during routine business hours, please contact your provider's office and ask to speak with a nurse.   For questions related to your Healthy [facility] health plan, please call: [phone] or visit the homepage here: [email or web address]  If you would like to schedule transportation through your Healthy [facility] plan, please call the following number at least 2 days in advance of your appointment: [phone]  For information about your ride after you set it up, call Ride Assist at [phone]. Use this number to activate a Will Call pickup, or if your transportation is late for a scheduled pickup. Use this number, too, if you need to make a change or cancel a previously scheduled reservation.  If you need transportation services right away, call [phone]. The after-hours call center is staffed 24 hours to handle ride assistance  and urgent reservation requests (including discharges) 365 days a year. Urgent trips include sick visits, hospital discharge requests and life-sustaining treatment.  Call the [place] at [phone], at any time, 24 hours a day, 7 days a week. If you are in danger or need immediate medical attention call 911.  If you would like help to quit smoking, call 1-800-QUIT-NOW (416)774-9491) OR Espaol: 1-855-Djelo-Ya HD:1601594) o para ms informacin haga clic aqu or Text READY to 200-400 to register via text  Stephen Bruce - following are the goals we discussed in your visit today:   Goals Addressed   None       Social Worker will follow up in 30 days.   Mickel Fuchs, Arita Miss, MHA [facility]  Managed Medicaid Social Worker [phone]   Following is a copy of your plan of care:  There are no care plans that you recently modified to display for this patient.   Visit Information  Stephen Bruce was given information about Medicaid Managed Care team care coordination services as a part of their Healthy [facility] Medicaid benefit. Stephen Bruce verbally consented to engagement with the [facility] Managed Care team.   If you are experiencing a medical emergency, please call 911 or report to your local emergency department or urgent care.   If you have a non-emergency medical problem during routine business hours, please contact your provider's office and ask to speak with a nurse.   For questions related to your Healthy [facility] health plan, please call: [phone] or visit the homepage here: [email or web address]  If you would like to schedule transportation through your Healthy [facility] plan, please call the following number at  least 2 days in advance of your appointment: [phone]  For information about your ride after you set it up, call Ride Assist at [phone]. Use this number to activate a Will Call  pickup, or if your transportation is late for a scheduled pickup. Use this number, too, if you need to make a change or cancel a previously scheduled reservation.  If you need transportation services right away, call [phone]. The after-hours call center is staffed 24 hours to handle ride assistance and urgent reservation requests (including discharges) 365 days a year. Urgent trips include sick visits, hospital discharge requests and life-sustaining treatment.  Call the [place] at [phone], at any time, 24 hours a day, 7 days a week. If you are in danger or need immediate medical attention call 911.  If you would like help to quit smoking, call 1-800-QUIT-NOW 424-855-9412) OR Espaol: 1-855-Djelo-Ya HD:1601594) o para ms informacin haga clic aqu or Text READY to 200-400 to register via text  Stephen Bruce - following are the goals we discussed in your visit today:   Goals Addressed   None      Social Worker will follow up in 30 days.   Mickel Fuchs, Arita Miss, MHA [facility]  Managed Medicaid Social Worker [phone]   Following is a copy of your plan of care:  There are no care plans that you recently modified to display for this patient.

## 2022-05-25 NOTE — Patient Outreach (Signed)
  Medicaid Managed Care   Unsuccessful Outreach Note  05/25/2022 Name: Stephen Bruce MRN: [id number] DOB: Mar 13, 2000  Referred by: Minette Brine, FNP Reason for referral : High Risk Managed Medicaid (MM social work unsuccessful telephone outreach )   An unsuccessful telephone outreach was attempted today. The patient was referred to the case management team for assistance with care management and care coordination.   Follow Up Plan: The care management team will reach out to the patient again over the next 7-10 days.   Marga Melnick, [place] Social Worker [phone]

## 2022-06-16 ENCOUNTER — Other Ambulatory Visit: Payer: Self-pay

## 2022-06-16 ENCOUNTER — Encounter (HOSPITAL_COMMUNITY): Payer: Self-pay

## 2022-06-16 ENCOUNTER — Emergency Department (HOSPITAL_COMMUNITY)
Admission: EM | Admit: 2022-06-16 | Discharge: 2022-06-16 | Disposition: A | Discharge status: Home / Self Care | Payer: Medicaid Other | Attending: Emergency Medicine | Admitting: Emergency Medicine

## 2022-06-16 DIAGNOSIS — L02811 Cutaneous abscess of head [any part, except face]: Secondary | ICD-10-CM | POA: Diagnosis not present

## 2022-06-16 MED ORDER — LIDOCAINE HCL (PF) 1 % IJ SOLN
10.0000 mL | Freq: Once | INTRAMUSCULAR | Status: AC
  Administered 2022-06-16: 10 mL
  Filled 2022-06-16: qty 10

## 2022-06-16 NOTE — ED Triage Notes (Signed)
Pt came in via POV d/t a bump on the back of his head. Does have a Hx of boils & the last one on his head was May 2023. A/Ox4, denies pain or fevers.

## 2022-06-16 NOTE — ED Provider Notes (Signed)
EMERGENCY DEPARTMENT AT [facility] Provider Note   CSN: [id number] Arrival date & time: 06/16/22  5366     History  No chief complaint on file.   Stephen Bruce is a 22 y.o. male.  Patient is here with his mother.  She reports that he has a knot on the back of his head.  Patient has had the area there since January.  She has scheduled him to see a dermatologist but the dermatologist will not see him until January of next year.  He has been seen at urgent care and by his primary care physician who have advised that he needs to see a dermatologist.  Patient's mother reports the area has increased in size and redness.  She states that he has had multiple areas like this in the past that have drained or have been drained she is requesting I&D of the area  The history is provided by the patient and a parent.       Home Medications Prior to Admission medications   Medication Sig Start Date End Date Taking? Authorizing Provider  cephALEXin (KEFLEX) 250 MG/5ML suspension Take 5 mLs (250 mg total) by mouth 4 (four) times daily. 05/22/22   [name], FNP  fluticasone (FLONASE) 50 MCG/ACT nasal spray USE 1-2 SPRAYS IN EACH NOSTRIL ONCE DAILY FOR STUFFY NOSE OR DRAINAGE. Patient not taking: Reported on 05/22/2022 09/21/15   Alfonse Spruce, MD  ibuprofen (ADVIL) 600 MG tablet Take 1 tablet (600 mg total) by mouth every 6 (six) hours as needed. 01/31/22   Rising, Lurena Joiner, PA-C  loratadine (CLARITIN) 10 MG tablet TAKE 1 TABLET (10 MG TOTAL) BY MOUTH DAILY. 06/30/16   Alfonse Spruce, MD  Misc. Devices MISC New cpap machine on current settings and supplies.  DME: [facility] 12/25/19   [provider]  Vitamin D, Ergocalciferol, (DRISDOL) 1.25 MG (50000 UNIT) CAPS capsule Take 1 capsule (50,000 Units total) by mouth 2 (two) times a week. 05/25/22   [name], FNP      Allergies    Shellfish allergy and Shellfish-derived products    Review of Systems    Review of Systems  All other systems reviewed and are negative.   Physical Exam Updated Vital Signs BP 122/85 (BP Location: Right Arm)   Pulse 81   Temp 98.9 F (37.2 C) (Oral)   Resp 16   SpO2 96%  Physical Exam Vitals and nursing note reviewed.  Constitutional:      Appearance: He is well-developed.  HENT:     Head: Normocephalic.     Comments: 3 cm cystic-appearing area right scalp with loss of hair around the area.  There is some fluctuance Cardiovascular:     Rate and Rhythm: Normal rate.  Pulmonary:     Effort: Pulmonary effort is normal.  Abdominal:     General: There is no distension.  Musculoskeletal:        General: Normal range of motion.  Skin:    General: Skin is warm.  Neurological:     General: No focal deficit present.     Mental Status: He is alert and oriented to person, place, and time.     ED Results / Procedures / Treatments   Labs (all labs ordered are listed, but only abnormal results are displayed) Labs Reviewed - No data to display  EKG None  Radiology No results found.  Procedures .Marland KitchenIncision and Drainage  Date/Time: 06/16/2022 9:49 PM  Performed by: Keenan Bachelor,  Lonia Skinner, PA-C Authorized by: Elson Areas, PA-C   Consent:    Consent obtained:  Verbal   Consent given by:  Patient and parent   Risks, benefits, and alternatives were discussed: yes     Risks discussed:  Bleeding and incomplete drainage   Alternatives discussed:  No treatment Universal protocol:    Procedure explained and questions answered to patient or proxy's satisfaction: yes     Immediately prior to procedure, a time out was called: yes     Patient identity confirmed:  Verbally with patient Location:    Type:  Abscess   Size:  80   Location:  Head   Head location:  Scalp Pre-procedure details:    Skin preparation:  Povidone-iodine Sedation:    Sedation type:  None Anesthesia:    Anesthesia method:  Local infiltration   Local anesthetic:  Lidocaine 1%  WITH epi Procedure type:    Complexity:  Simple Procedure details:    Incision types:  Single straight   Incision depth:  Submucosal   Wound management:  Probed and deloculated   Drainage:  Purulent   Drainage amount:  Moderate   Wound treatment:  Wound left open   Packing materials:  None Post-procedure details:    Procedure completion:  Tolerated     Medications Ordered in ED Medications  lidocaine (PF) (XYLOCAINE) 1 % injection 10 mL (10 mLs Infiltration Given 06/16/22 2004)    ED Course/ Medical Decision Making/ A&P                             Medical Decision Making Patient's mother reports patient has a cystic area on his scalp that is increasing in size and redness  Amount and/or Complexity of Data Reviewed Independent Historian: parent    Details: His mother provides most of his history  Risk Prescription drug management. Risk Details: A small incision was made and purulent drainage was expressed from right scalp lesion.  I have advised mother that patient does need follow-up with dermatology.  I have advised her that this area may return.  Advise return to the emergency department if any problems           Final Clinical Impression(s) / ED Diagnoses Final diagnoses:  Abscess, scalp    Rx / DC Orders ED Discharge Orders     None      An After Visit Summary was printed and given to the patient.    Osie Cheeks 06/16/22 2151    Gwyneth Sprout, MD 06/17/22 [phone]

## 2022-06-21 ENCOUNTER — Telehealth: Payer: Self-pay

## 2022-06-21 NOTE — Transitions of Care (Post Inpatient/ED Visit) (Signed)
error 

## 2022-06-21 NOTE — Transitions of Care (Post Inpatient/ED Visit) (Signed)
   06/21/2022  Name: Stephen Bruce MRN: [id number] DOB: 2000/10/26  Today's TOC FU Call Status: Today's TOC FU Call Status:: Unsuccessul Call (1st Attempt) Unsuccessful Call (1st Attempt) Date: 06/21/22  Attempted to reach the patient regarding the most recent Inpatient/ED visit.  Follow Up Plan: Additional outreach attempts will be made to reach the patient to complete the Transitions of Care (Post Inpatient/ED visit) call.   Signature YL,RMA

## 2022-06-22 ENCOUNTER — Telehealth: Payer: Self-pay

## 2022-06-22 NOTE — Transitions of Care (Post Inpatient/ED Visit) (Signed)
   06/22/2022  Name: Stephen Bruce MRN: [id number] DOB: 01/11/2001  Today's TOC FU Call Status: Today's TOC FU Call Status:: Successful TOC FU Call Competed Unsuccessful Call (1st Attempt) Date: 06/22/22  Transition Care Management Follow-up Telephone Call Date of Discharge: 06/16/22 Discharge Facility: [facility]) Reason for ED Visit: Other: How have you been since you were released from the hospital?: Same Any questions or concerns?: No  Items Reviewed: Did you receive and understand the discharge instructions provided?: Yes Medications obtained,verified, and reconciled?: Yes (Medications Reviewed) Any new allergies since your discharge?: No Dietary orders reviewed?: No Do you have support at home?: Yes People in Home: parent(s) Name of Support/Comfort Primary Source: mother  Medications Reviewed Today: Medications Reviewed Today     Reviewed by [name], FNP (Family Nurse Practitioner) on 05/22/22 at 1559  Med List Status: <None>   Medication Order Taking? Sig Documenting Provider Last Dose Status Informant  fluticasone (FLONASE) 50 MCG/ACT nasal spray [id number] No USE 1-2 SPRAYS IN EACH NOSTRIL ONCE DAILY FOR STUFFY NOSE OR DRAINAGE.  Patient not taking: Reported on 05/22/2022   Alfonse Spruce, MD Not Taking Active            Med Note Ed Blalock Mar 26, 2022  2:42 PM) Prn   ibuprofen (ADVIL) 600 MG tablet [id number] Yes Take 1 tablet (600 mg total) by mouth every 6 (six) hours as needed. Rising, Lurena Joiner, PA-C Taking Active   loratadine (CLARITIN) 10 MG tablet [id number] Yes TAKE 1 TABLET (10 MG TOTAL) BY MOUTH DAILY. Alfonse Spruce, MD Taking Active   Misc. Devices MISC [id number] Yes New cpap machine on current settings and supplies.  DME: [facility] [provider] Taking Active             Home Care and Equipment/Supplies: Were Home Health Services Ordered?: No Any new equipment or medical supplies ordered?:  No  Functional Questionnaire: Do you need assistance with bathing/showering or dressing?: No Do you need assistance with meal preparation?: No Do you need assistance with eating?: No Do you have difficulty maintaining continence: No Do you need assistance with getting out of bed/getting out of a chair/moving?: No Do you have difficulty managing or taking your medications?: No  Follow up appointments reviewed: PCP Follow-up appointment confirmed?: Yes MD Provider Line Number:[phone] Given: No Specialist Hospital Follow-up appointment confirmed?: Yes Date of Specialist follow-up appointment?: 08/15/22 Follow-Up Specialty Provider:: Dermatology Do you need transportation to your follow-up appointment?: No Do you understand care options if your condition(s) worsen?: Yes-patient verbalized understanding  Signature:YL,RMA

## 2022-06-26 ENCOUNTER — Other Ambulatory Visit: Payer: Medicaid Other

## 2022-06-26 NOTE — Patient Outreach (Signed)
  Medicaid Managed Care Social Work Note  06/26/2022 Name:  Stephen Bruce MRN:  [id number] DOB:  [birth year]  Stephen Bruce is an 22 y.o. year old male who is a primary patient of [name], FNP.  The [facility] Managed Care Coordination team was consulted for assistance with:   Guardianship  Stephen Bruce was given information about Medicaid Managed Care Coordination team services today. Stephen Bruce by telephone agreed to services and verbal consent obtained.  Engaged with patient  for by telephone forfollow up visit in response to referral for case management and/or care coordination services.   Assessments/Interventions:  Review of past medical history, allergies, medications, health status, including review of consultants reports, laboratory and other test data, was performed as part of comprehensive evaluation and provision of chronic care management services.  SDOH: (Social Determinant of Health) assessments and interventions performed: BSW completed a telephone outreach with mom. She states she did receive the resources BSW sent regarding guardianship. No other resources are needed at this time.   Advanced Directives Status:  Not addressed in this encounter.  Care Plan                 Allergies  Allergen Reactions   Shellfish Allergy Other (See Comments)    POSITIVE ON ALLERGY TESTING   Shellfish-Derived Products Other (See Comments)    POSITIVE ON ALLERGY TESTING    Medications Reviewed Today     Reviewed by [name], FNP (Family Nurse Practitioner) on 05/22/22 at 1559  Med List Status: <None>   Medication Order Taking? Sig Documenting Provider Last Dose Status Informant  fluticasone (FLONASE) 50 MCG/ACT nasal spray [id number] No USE 1-2 SPRAYS IN EACH NOSTRIL ONCE DAILY FOR STUFFY NOSE OR DRAINAGE.  Patient not taking: Reported on 05/22/2022   Alfonse Spruce, MD Not Taking Active            Med Note Ed Blalock Mar 26, 2022  2:42 PM) Prn    ibuprofen (ADVIL) 600 MG tablet [id number] Yes Take 1 tablet (600 mg total) by mouth every 6 (six) hours as needed. Rising, Lurena Joiner, PA-C Taking Active   loratadine (CLARITIN) 10 MG tablet [id number] Yes TAKE 1 TABLET (10 MG TOTAL) BY MOUTH DAILY. Alfonse Spruce, MD Taking Active   Misc. Devices MISC [id number] Yes New cpap machine on current settings and supplies.  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   Follow up:  Patient agrees to Care Plan and Follow-up.  Plan: The  Parent has been provided with contact information for the Managed Medicaid care management team and has been advised to call with any health related questions or concerns.    Abelino Derrick, MHA [facility]  Managed [facility] Social Worker [phone]

## 2022-06-26 NOTE — Patient Instructions (Signed)
Visit Information  Mr. Beckius was given information about Medicaid Managed Care team care coordination services as a part of their Healthy [facility] Medicaid benefit. [name] verbally consented to engagement with the [facility] Managed Care team.   If you are experiencing a medical emergency, please call 911 or report to your local emergency department or urgent care.   If you have a non-emergency medical problem during routine business hours, please contact your provider's office and ask to speak with a nurse.   For questions related to your Healthy [facility] health plan, please call: [phone] or visit the homepage here: [email or web address]  If you would like to schedule transportation through your Healthy [facility] plan, please call the following number at least 2 days in advance of your appointment: [phone]  For information about your ride after you set it up, call Ride Assist at [phone]. Use this number to activate a Will Call pickup, or if your transportation is late for a scheduled pickup. Use this number, too, if you need to make a change or cancel a previously scheduled reservation.  If you need transportation services right away, call [phone]. The after-hours call center is staffed 24 hours to handle ride assistance and urgent reservation requests (including discharges) 365 days a year. Urgent trips include sick visits, hospital discharge requests and life-sustaining treatment.  Call the [facility] Line at [phone], at any time, 24 hours a day, 7 days a week. If you are in danger or need immediate medical attention call 911.  If you would like help to quit smoking, call 1-800-QUIT-NOW ((772) 133-1621) OR Espaol: 1-855-Djelo-Ya (2-595-638-7564) o para ms informacin haga clic aqu or Text READY to 332-951 to register via text  Mr. Metze - following are the goals we discussed in your visit today:    Goals Addressed   None       The  Parent                                                                         has been provided with contact information for the Managed Medicaid care management team and has been advised to call with any health related questions or concerns.   Gus Puma, Kenard Gower, MHA [facility]  Managed Medicaid Social Worker [phone]   Following is a copy of your plan of care:  There are no care plans that you recently modified to display for this patient.

## 2022-07-04 DIAGNOSIS — G4733 Obstructive sleep apnea (adult) (pediatric): Secondary | ICD-10-CM | POA: Diagnosis not present

## 2022-07-10 ENCOUNTER — Other Ambulatory Visit: Payer: Self-pay | Admitting: Nurse Practitioner

## 2022-07-12 NOTE — Telephone Encounter (Signed)
Why does he need a refill on this medication? If it is for the abscess on his scalp he will need an office visit to f/u

## 2022-07-25 DIAGNOSIS — F909 Attention-deficit hyperactivity disorder, unspecified type: Secondary | ICD-10-CM | POA: Diagnosis not present

## 2022-07-27 ENCOUNTER — Encounter: Payer: Self-pay | Admitting: Dermatology

## 2022-07-27 ENCOUNTER — Ambulatory Visit (INDEPENDENT_AMBULATORY_CARE_PROVIDER_SITE_OTHER): Payer: Medicaid Other | Admitting: Dermatology

## 2022-07-27 ENCOUNTER — Other Ambulatory Visit: Payer: Self-pay | Admitting: Dermatology

## 2022-07-27 VITALS — BP 106/67

## 2022-07-27 DIAGNOSIS — B350 Tinea barbae and tinea capitis: Secondary | ICD-10-CM

## 2022-07-27 DIAGNOSIS — L986 Other infiltrative disorders of the skin and subcutaneous tissue: Secondary | ICD-10-CM | POA: Diagnosis not present

## 2022-07-27 DIAGNOSIS — L739 Follicular disorder, unspecified: Secondary | ICD-10-CM

## 2022-07-27 DIAGNOSIS — L089 Local infection of the skin and subcutaneous tissue, unspecified: Secondary | ICD-10-CM | POA: Diagnosis not present

## 2022-07-27 MED ORDER — MUPIROCIN 2 % EX OINT: 1.0000 | TOPICAL_OINTMENT | Freq: Two times a day (BID) | CUTANEOUS | 0 refills | Status: DC

## 2022-07-27 MED ORDER — SULFAMETHOXAZOLE-TRIMETHOPRIM 800-160 MG PO TABS: 1.0000 | ORAL_TABLET | Freq: Two times a day (BID) | ORAL | 0 refills | Status: DC

## 2022-07-27 NOTE — Progress Notes (Signed)
   New Patient Visit   Subjective  Stephen Bruce is a 22 y.o. male who presents for the following: Swelling, discharge, and hair loss at the right scalp. He has had symptoms for a year. He has been put on oral antibiotics periodically for inflammation. Most recently Keflex 250mg  oral suspension 4 x daily for one week starting on 05/22/2022. Doxycycline treatment one month ago. They tried OTC BPO cleanser and Head and Shoulders shampoo. Tender to touch. Pain is a 9 with touch. It drained today. He was in the ER one month ago and had it drained.   The following portions of the chart were reviewed this encounter and updated as appropriate: medications, allergies, medical history  Review of Systems:  No other skin or systemic complaints except as noted in HPI or Assessment and Plan.  Objective  Well appearing patient in no apparent distress; mood and affect are within normal limits.    A focused examination was performed of the following areas: Scalp  Relevant exam findings are noted in the Assessment and Plan.  Advised DHS Zinc SHampoo   Assessment & Plan     Local infection of skin and subcutaneous tissue Right Occipital Scalp  Related Procedures Aerobic Culture  Kerion Mid Parietal Scalp  Skin / nail biopsy - Mid Parietal Scalp Type of biopsy: punch   Informed consent: discussed and consent obtained   Timeout: patient name, date of birth, surgical site, and procedure verified   Procedure prep:  Patient was prepped and draped in usual sterile fashion Prep type:  Isopropyl alcohol Anesthesia: the lesion was anesthetized in a standard fashion   Anesthetic:  1% lidocaine w/ epinephrine 1-100,000 buffered w/ 8.4% NaHCO3 Punch size:  3 mm Suture size:  4-0 Suture type: nylon   Hemostasis achieved with: suture   Outcome: patient tolerated procedure well   Post-procedure details: sterile dressing applied and wound care instructions given   Dressing type: pressure dressing     Skin biopsy - Mid Parietal Scalp   Treatment Plan: -Rx for topical mupirocin sent -Pt recently finished course of doxy and keflex with no improvement.   -I will wait for culture and bx results before giving new oral abx  Return in about 2 weeks (around 08/10/2022) for Suture Removal.  I, Mosetta Anis, CMA, am acting as scribe for [employer], DO.   Documentation: I have reviewed the above documentation for accuracy and completeness, and I agree with the above.  Langston Reusing, DO

## 2022-07-27 NOTE — Patient Instructions (Addendum)
Patient Handout: Wound Care for Skin Biopsy Site  Patient Handout: Wound Care for Skin Biopsy Site  Taking Care of Your Skin Biopsy Site  Proper care of the biopsy site is essential for promoting healing and minimizing scarring. This handout provides instructions on how to care for your biopsy site to ensure optimal recovery.  1. Cleaning the Wound:  Clean the biopsy site daily with gentle soap and water. Gently pat the area dry with a clean, soft towel. Avoid harsh scrubbing or rubbing the area, as this can irritate the skin and delay healing.  2. Applying Aquaphor and Bandage:  After cleaning the wound, apply a thin layer of Aquaphor ointment to the biopsy site. Cover the area with a sterile bandage to protect it from dirt, bacteria, and friction. Change the bandage daily or as needed if it becomes soiled or wet.  3. Continued Care for One Week:  Repeat the cleaning, Aquaphor application, and bandaging process daily for one week following the biopsy procedure. Keeping the wound clean and moist during this initial healing period will help prevent infection and promote optimal healing.  4. Massaging Aquaphor into the Area:  ---After one week, discontinue the use of bandages but continue to apply Aquaphor to the biopsy site. ----Gently massage the Aquaphor into the area using circular motions. ---Massaging the skin helps to promote circulation and prevent the formation of scar tissue.   Additional Tips:  Avoid exposing the biopsy site to direct sunlight during the healing process, as this can cause hyperpigmentation or worsen scarring. If you experience any signs of infection, such as increased redness, swelling, warmth, or drainage from the wound, contact your healthcare provider immediately. Follow any additional instructions provided by your healthcare provider for caring for the biopsy site and managing any discomfort. Conclusion:  Taking proper care of your skin biopsy site  is crucial for ensuring optimal healing and minimizing scarring. By following these instructions for cleaning, applying Aquaphor, and massaging the area, you can promote a smooth and successful recovery. If you have any questions or concerns about caring for your biopsy site, don't hesitate to contact your healthcare provider for guidance.      Due to recent changes in healthcare laws, you may see results of your pathology and/or laboratory studies on MyChart before the doctors have had a chance to review them. We understand that in some cases there may be results that are confusing or concerning to you. Please understand that not all results are received at the same time and often the doctors may need to interpret multiple results in order to provide you with the best plan of care or course of treatment. Therefore, we ask that you please give us 2 business days to thoroughly review all your results before contacting the office for clarification. Should we see a critical lab result, you will be contacted sooner.   If You Need Anything After Your Visit  If you have any questions or concerns for your doctor, please call our main line at [phone] If no one answers, please leave a voicemail as directed and we will return your call as soon as possible. Messages left after 4 pm will be answered the following business day.   You may also send us a message via MyChart. We typically respond to MyChart messages within 1-2 business days.  For prescription refills, please ask your pharmacy to contact our office. Our fax number is [phone].  If you have an urgent issue when the clinic is   closed that cannot wait until the next business day, you can page your doctor at the number below.    Please note that while we do our best to be available for urgent issues outside of office hours, we are not available 24/7.   If you have an urgent issue and are unable to reach us, you may choose to seek medical care  at your doctor's office, retail clinic, urgent care center, or emergency room.  If you have a medical emergency, please immediately call 911 or go to the emergency department. In the event of inclement weather, please call our main line at [phone] for an update on the status of any delays or closures.  Dermatology Medication Tips: Please keep the boxes that topical medications come in in order to help keep track of the instructions about where and how to use these. Pharmacies typically print the medication instructions only on the boxes and not directly on the medication tubes.   If your medication is too expensive, please contact our office at [phone] or send us a message through MyChart.   We are unable to tell what your co-pay for medications will be in advance as this is different depending on your insurance coverage. However, we may be able to find a substitute medication at lower cost or fill out paperwork to get insurance to cover a needed medication.   If a prior authorization is required to get your medication covered by your insurance company, please allow us 1-2 business days to complete this process.  Drug prices often vary depending on where the prescription is filled and some pharmacies may offer cheaper prices.  The website www.goodrx.com contains coupons for medications through different pharmacies. The prices here do not account for what the cost may be with help from insurance (it may be cheaper with your insurance), but the website can give you the price if you did not use any insurance.  - You can print the associated coupon and take it with your prescription to the pharmacy.  - You may also stop by our office during regular business hours and pick up a GoodRx coupon card.  - If you need your prescription sent electronically to a different pharmacy, notify our office through [facility] MyChart or by phone at [phone]     

## 2022-07-28 DIAGNOSIS — L089 Local infection of the skin and subcutaneous tissue, unspecified: Secondary | ICD-10-CM | POA: Diagnosis not present

## 2022-08-01 LAB — AEROBIC CULTURE

## 2022-08-03 ENCOUNTER — Encounter (HOSPITAL_COMMUNITY): Payer: Self-pay | Admitting: Emergency Medicine

## 2022-08-03 ENCOUNTER — Emergency Department (HOSPITAL_COMMUNITY): Payer: Medicaid Other

## 2022-08-03 ENCOUNTER — Emergency Department (HOSPITAL_COMMUNITY)
Admission: EM | Admit: 2022-08-03 | Discharge: 2022-08-04 | Disposition: A | Discharge status: Home / Self Care | Payer: Medicaid Other | Attending: Emergency Medicine | Admitting: Emergency Medicine

## 2022-08-03 ENCOUNTER — Other Ambulatory Visit: Payer: Self-pay

## 2022-08-03 DIAGNOSIS — B350 Tinea barbae and tinea capitis: Secondary | ICD-10-CM | POA: Diagnosis not present

## 2022-08-03 DIAGNOSIS — L270 Generalized skin eruption due to drugs and medicaments taken internally: Secondary | ICD-10-CM

## 2022-08-03 DIAGNOSIS — R509 Fever, unspecified: Secondary | ICD-10-CM | POA: Diagnosis not present

## 2022-08-03 DIAGNOSIS — R000 Tachycardia, unspecified: Secondary | ICD-10-CM | POA: Insufficient documentation

## 2022-08-03 DIAGNOSIS — L739 Follicular disorder, unspecified: Secondary | ICD-10-CM

## 2022-08-03 DIAGNOSIS — R519 Headache, unspecified: Secondary | ICD-10-CM | POA: Diagnosis not present

## 2022-08-03 DIAGNOSIS — Z20822 Contact with and (suspected) exposure to covid-19: Secondary | ICD-10-CM | POA: Diagnosis not present

## 2022-08-03 LAB — BASIC METABOLIC PANEL
Anion gap: 11 (ref 5–15)
BUN: 15 mg/dL (ref 6–20)
CO2: 26 mmol/L (ref 22–32)
Calcium: 8.3 mg/dL — ABNORMAL LOW (ref 8.9–10.3)
Chloride: 96 mmol/L — ABNORMAL LOW (ref 98–111)
Creatinine, Ser: 1.41 mg/dL — ABNORMAL HIGH (ref 0.61–1.24)
GFR, Estimated: >60 mL/min (ref >60)
Glucose, Bld: 109 mg/dL — ABNORMAL HIGH (ref 70–99)
Potassium: 4 mmol/L (ref 3.5–5.1)
Sodium: 133 mmol/L — ABNORMAL LOW (ref 135–145)

## 2022-08-03 LAB — LACTIC ACID, PLASMA: Lactic Acid, Venous: 0.7 mmol/L (ref 0.5–1.9)

## 2022-08-03 LAB — CBC
HCT: 43.5 % (ref 39.0–52.0)
Hemoglobin: 14.5 g/dL (ref 13.0–17.0)
MCH: 30.3 pg (ref 26.0–34.0)
MCHC: 33.3 g/dL (ref 30.0–36.0)
MCV: 90.8 fL (ref 80.0–100.0)
Platelets: 120 10*3/uL — ABNORMAL LOW (ref 150–400)
RBC: 4.79 MIL/uL (ref 4.22–5.81)
RDW: 12.3 % (ref 11.5–15.5)
WBC: 5.2 10*3/uL (ref 4.0–10.5)
nRBC: 0 % (ref 0.0–0.2)

## 2022-08-03 MED ORDER — ONDANSETRON HCL 4 MG/2ML IJ SOLN
4.0000 mg | Freq: Once | INTRAMUSCULAR | Status: AC
  Administered 2022-08-03: 4 mg via INTRAVENOUS
  Filled 2022-08-03: qty 2

## 2022-08-03 MED ORDER — ACETAMINOPHEN 325 MG PO TABS
650.0000 mg | ORAL_TABLET | Freq: Once | ORAL | Status: AC
  Administered 2022-08-03: 650 mg via ORAL
  Filled 2022-08-03: qty 2

## 2022-08-03 MED ORDER — SODIUM CHLORIDE 0.9 % IV BOLUS
1000.0000 mL | Freq: Once | INTRAVENOUS | Status: AC
  Administered 2022-08-03: 1000 mL via INTRAVENOUS

## 2022-08-03 NOTE — Progress Notes (Signed)
Culture Results: Routine flora  Will review with pt at follow up appointment

## 2022-08-03 NOTE — ED Provider Notes (Signed)
[place] EMERGENCY DEPARTMENT AT [facility] Provider Note   CSN: [id number] Arrival date & time: 08/03/22  1743     History  Chief Complaint  Patient presents with   Headache   Fatigue    Stephen Bruce is a 22 y.o. male.  Has a history of developmental delay and has had multiple abscesses on his head in the past.  He recently had an biopsy by dermatology and has been on Bactrim for almost a week.  Complaining of headache and nausea.  Has not been eating or drinking well.  Today noticed a rash on his arms.  No cough shortness of breath abdominal pain diarrhea or urinary symptoms.  Has had intermittent drainage from skin lesions on his scalp  The history is provided by the patient and a relative.  Headache Pain location:  Generalized Quality:  Dull Severity currently:  10/10 Severity at highest:  10/10 Onset quality:  Gradual Duration:  1 week Timing:  Constant Progression:  Unchanged Chronicity:  Recurrent Similar to prior headaches: yes   Relieved by:  None tried Worsened by:  Nothing Ineffective treatments:  None tried Associated symptoms: dizziness, fever, myalgias and nausea   Associated symptoms: no abdominal pain, no diarrhea, no neck pain, no neck stiffness and no vomiting        Home Medications Prior to Admission medications   Medication Sig Start Date End Date Taking? Authorizing Provider  cephALEXin (KEFLEX) 250 MG/5ML suspension Take 5 mLs (250 mg total) by mouth 4 (four) times daily. 05/22/22   [name], FNP  fluticasone (FLONASE) 50 MCG/ACT nasal spray USE 1-2 SPRAYS IN EACH NOSTRIL ONCE DAILY FOR STUFFY NOSE OR DRAINAGE. Patient not taking: Reported on 05/22/2022 09/21/15   Alfonse Spruce, MD  ibuprofen (ADVIL) 600 MG tablet Take 1 tablet (600 mg total) by mouth every 6 (six) hours as needed. 01/31/22   Rising, Lurena Joiner, PA-C  loratadine (CLARITIN) 10 MG tablet TAKE 1 TABLET (10 MG TOTAL) BY MOUTH DAILY. 06/30/16   Alfonse Spruce,  MD  Misc. Devices MISC New cpap machine on current settings and supplies.  DME: [employer] 12/25/19   [provider]  mupirocin ointment (BACTROBAN) 2 % Apply 1 Application topically 2 (two) times daily. Apply to wound twice daily and cover with a bandaid 07/27/22   Terri Piedra, DO  sulfamethoxazole-trimethoprim (BACTRIM DS) 800-160 MG tablet Take 1 tablet by mouth 2 (two) times daily for 10 days. Take with food 07/27/22 08/06/22  Terri Piedra, DO  Vitamin D, Ergocalciferol, (DRISDOL) 1.25 MG (50000 UNIT) CAPS capsule Take 1 capsule (50,000 Units total) by mouth 2 (two) times a week. 05/25/22   [name], FNP      Allergies    Shellfish allergy and Shellfish-derived products    Review of Systems   Review of Systems  Constitutional:  Positive for fever.  Eyes:  Negative for visual disturbance.  Respiratory:  Negative for shortness of breath.   Cardiovascular:  Negative for chest pain.  Gastrointestinal:  Positive for nausea. Negative for abdominal pain, diarrhea and vomiting.  Musculoskeletal:  Positive for myalgias. Negative for neck pain and neck stiffness.  Skin:  Positive for wound.  Neurological:  Positive for dizziness and headaches.    Physical Exam Updated Vital Signs BP 119/74 (BP Location: Right Arm)   Pulse (!) 103   Temp (!) 100.7 F (38.2 C) (Oral)   Resp 16   Ht 5\' 7"  (1.702 m)   Wt  79.4 kg   SpO2 98%   BMI 27.41 kg/m  Physical Exam Vitals and nursing note reviewed.  Constitutional:      General: He is not in acute distress.    Appearance: He is well-developed.  HENT:     Head: Atraumatic.     Comments: He has a soft tissue fluctuant area on his occipital scalp.  There is no drainage or surrounding erythema at this time.    Mouth/Throat:     Mouth: Mucous membranes are moist.     Pharynx: Oropharynx is clear.     Comments: He has some chapped lips although his family member says this is baseline for him.  There is no intraoral lesions that  I can appreciate. Eyes:     Conjunctiva/sclera: Conjunctivae normal.  Cardiovascular:     Rate and Rhythm: Regular rhythm. Tachycardia present.     Heart sounds: No murmur heard. Pulmonary:     Effort: Pulmonary effort is normal. No respiratory distress.     Breath sounds: Normal breath sounds.  Abdominal:     Palpations: Abdomen is soft.     Tenderness: There is no abdominal tenderness.  Musculoskeletal:        General: Normal range of motion.     Cervical back: Neck supple.  Skin:    General: Skin is warm and dry.     Capillary Refill: Capillary refill takes less than 2 seconds.     Findings: Rash present.     Comments: He has an erythematous rash on his forearms.  Neurological:     General: No focal deficit present.     Mental Status: He is alert. Mental status is at baseline.     ED Results / Procedures / Treatments   Labs (all labs ordered are listed, but only abnormal results are displayed) Labs Reviewed  CBC - Abnormal; Notable for the following components:      Result Value   Platelets 120 (*)    All other components within normal limits  BASIC METABOLIC PANEL - Abnormal; Notable for the following components:   Sodium 133 (*)    Chloride 96 (*)    Glucose, Bld 109 (*)    Creatinine, Ser 1.41 (*)    Calcium 8.3 (*)    All other components within normal limits  SARS CORONAVIRUS 2 BY RT PCR  LACTIC ACID, PLASMA    EKG None  Radiology CT HEAD WO CONTRAST ( )  Result Date: 08/04/2022 CLINICAL DATA:  Worsening headache EXAM: CT HEAD WITHOUT CONTRAST TECHNIQUE: Contiguous axial images were obtained from the base of the skull through the vertex without intravenous contrast. RADIATION DOSE REDUCTION: This exam was performed according to the departmental dose-optimization program which includes automated exposure control, adjustment of the mA and/or kV according to patient size and/or use of iterative reconstruction technique. COMPARISON:  11/03/2012 FINDINGS:  Brain: No evidence of acute infarct, hemorrhage, mass, mass effect, or midline shift. No hydrocephalus or extra-axial fluid collection. Gray-white differentiation is preserved. Vascular: No hyperdense vessel. Skull: Negative for fracture or focal lesion. Low-density lesion with associated skin thickening along the right parietal scalp, which measures up to 1.4 x 3.8 x 5.5 cm. Sinuses/Orbits: Clear paranasal sinuses. No acute finding in the orbits. Other: The mastoid air cells are well aerated. IMPRESSION: 1. No acute intracranial process. No etiology is seen for the patient's headache 2. Low-density lesion with associated skin thickening along the right parietal scalp, which measures up to 5.5 cm. Correlate with physical exam.  Electronically Signed   By: Wiliam Ke M.D.   On: 08/04/2022 00:18    Procedures Procedures    Medications Ordered in ED Medications  sodium chloride 0.9 % bolus 1,000 mL (1,000 mLs Intravenous New Bag/Given 08/03/22 2254)  ondansetron (ZOFRAN) injection 4 mg (4 mg Intravenous Given 08/03/22 2256)  acetaminophen (TYLENOL) tablet 650 mg (650 mg Oral Given 08/03/22 2254)    ED Course/ Medical Decision Making/ A&P                             Medical Decision Making Amount and/or Complexity of Data Reviewed Labs: ordered. Radiology: ordered.  Risk OTC drugs. Prescription drug management.   This patient complains of headache body aches; this involves an extensive number of treatment Options and is a complaint that carries with it a high risk of complications and morbidity. The differential includes sepsis, Sirs, viral syndrome, bacteremia, allergic reaction  I ordered, reviewed and interpreted labs, which included normal white count normal hemoglobin, platelets mildly low, chemistries with elevated creatinine, lactate normal, COVID-negative I ordered medication IV fluids, nausea medication, Tylenol and reviewed PMP when indicated. I ordered imaging studies which  included CT head and I independently    visualized and interpreted imaging which showed skin thickening of scalp Additional history obtained from patient's family member Previous records obtained and reviewed in epic including recent dermatology notes and prior ED visits  Cardiac monitoring reviewed, sinus tachycardia Social determinants considered, no significant barriers Critical Interventions: None  After the interventions stated above, I reevaluated the patient and found patient to be awake alert tolerating p.o. Admission and further testing considered, his care is signed out to Dr. Oletta Cohn to follow-up on results of CT and COVID testing.  Likely can be discharged, change antibiotics due to possible allergic reaction.  No evidence of Stevens-Johnson at this time.         Final Clinical Impression(s) / ED Diagnoses Final diagnoses:  Kerion of occipital region of scalp  Fever in adult  Drug-induced skin rash  Fever, unspecified fever cause  Folliculitis    Rx / DC Orders ED Discharge Orders          Ordered    doxycycline (VIBRAMYCIN) 100 MG capsule  2 times daily        08/04/22 0036              Terrilee Files, MD 08/04/22 [phone]

## 2022-08-03 NOTE — ED Triage Notes (Signed)
Pt c/o headache and fatigue x 1 week. Recently prescribed abx and mupirocin ointment for abscess to posterior scalp.

## 2022-08-03 NOTE — ED Notes (Signed)
Patient transported to CT 

## 2022-08-04 DIAGNOSIS — R519 Headache, unspecified: Secondary | ICD-10-CM | POA: Diagnosis not present

## 2022-08-04 LAB — SARS CORONAVIRUS 2 BY RT PCR: SARS Coronavirus 2 by RT PCR: NEGATIVE

## 2022-08-04 MED ORDER — DOXYCYCLINE HYCLATE 100 MG PO CAPS: 100.0000 mg | ORAL_CAPSULE | Freq: Two times a day (BID) | ORAL | 0 refills | Status: DC

## 2022-08-04 MED ORDER — IBUPROFEN 400 MG PO TABS
400.0000 mg | ORAL_TABLET | Freq: Once | ORAL | Status: AC
  Administered 2022-08-04: 400 mg via ORAL
  Filled 2022-08-04: qty 1

## 2022-08-04 NOTE — ED Provider Notes (Signed)
Patient signed out to me by Dr. Charm Barges.  Patient here with headache in the setting of chronic scalp lesions.  He has been on Bactrim this week, prescribed by his dermatologist who treats his scalp condition.  Patient noted to have fever here.  Etiology unclear.  No specific symptoms other than the scalp thickening.  No drainage, it was biopsied in the office earlier this week.  No leukocytosis.  Lactic acid normal.  COVID-negative.  I did review the determine pathology.  Findings were consistent with folliculitis, could not differentiate between fungal and bacterial.  I added a CT head.  No intracranial pathology.  No fluid collections for drainage tonight.  Examination of the patient does reveal diffuse maculopapular rash.  This is concerning for drug rash.  Will stop the Bactrim.  Will provide doxycycline, father to contact dermatology in the morning.   Gilda Crease, MD 08/04/22 [phone]

## 2022-08-04 NOTE — Discharge Instructions (Addendum)
Stop taking the Bactrim immediately.  Contact your dermatologist in the morning.

## 2022-08-07 ENCOUNTER — Telehealth: Payer: Self-pay

## 2022-08-07 NOTE — Transitions of Care (Post Inpatient/ED Visit) (Signed)
   08/07/2022  Name: Stephen Bruce MRN: [id number] DOB: 06/18/2000  Today's TOC FU Call Status: Today's TOC FU Call Status:: Unsuccessul Call (1st Attempt) Unsuccessful Call (1st Attempt) Date: 08/07/22  Attempted to reach the patient regarding the most recent Inpatient/ED visit.  Follow Up Plan: Additional outreach attempts will be made to reach the patient to complete the Transitions of Care (Post Inpatient/ED visit) call.   Signature YL,RMA

## 2022-08-08 DIAGNOSIS — F909 Attention-deficit hyperactivity disorder, unspecified type: Secondary | ICD-10-CM | POA: Diagnosis not present

## 2022-08-09 ENCOUNTER — Ambulatory Visit: Payer: Medicaid Other | Admitting: Dermatology

## 2022-08-10 ENCOUNTER — Ambulatory Visit (INDEPENDENT_AMBULATORY_CARE_PROVIDER_SITE_OTHER): Payer: Medicaid Other | Admitting: Dermatology

## 2022-08-10 VITALS — BP 103/64 | HR 78

## 2022-08-10 DIAGNOSIS — L729 Follicular cyst of the skin and subcutaneous tissue, unspecified: Secondary | ICD-10-CM | POA: Diagnosis not present

## 2022-08-10 DIAGNOSIS — L739 Follicular disorder, unspecified: Secondary | ICD-10-CM | POA: Diagnosis not present

## 2022-08-10 DIAGNOSIS — L723 Sebaceous cyst: Secondary | ICD-10-CM

## 2022-08-10 MED ORDER — CLINDAMYCIN PHOSPHATE 1 % EX LOTN: TOPICAL_LOTION | CUTANEOUS | 0 refills | Status: AC

## 2022-08-10 MED ORDER — TRIAMCINOLONE ACETONIDE 10 MG/ML IJ SUSP
10.0000 mg | Freq: Once | INTRAMUSCULAR | Status: AC
Start: 2022-08-10 — End: 2022-08-10
  Administered 2022-08-10: 10 mg

## 2022-08-10 MED ORDER — DOXYCYCLINE HYCLATE 100 MG PO CAPS: 100.0000 mg | ORAL_CAPSULE | Freq: Every day | ORAL | 3 refills | Status: DC

## 2022-08-10 MED ORDER — TRETINOIN 0.025 % EX CREA: TOPICAL_CREAM | Freq: Every day | CUTANEOUS | 2 refills | Status: DC

## 2022-08-10 NOTE — Progress Notes (Signed)
Follow-Up Visit   Subjective  Stephen Bruce is a 22 y.o. male who presents for the following: biopsy on the scalp fon 07/27/22 for acute folliculitis.Mother states that pt  stopped Bactrim ( which he started 07/28/22 and had taken 3 days when rash started )due to allergic reaction of severe headache.which started 07/31/22 and a rash which started 2 days later and was seen in the ED.on 08/03/22 where he was told he had extremely high temp.He was given IV and tylenol. They stopped the Bactirim and he was given script for Doxycycline which he did not start.The rash is clear now. As it relates to the scalp it has  improved but not completely resolved.. .  The following portions of the chart were reviewed this encounter and updated as appropriate: medications, allergies, medical history  Review of Systems:  No other skin or systemic complaints except as noted in HPI or Assessment and Plan.  Objective  Well appearing patient in no apparent distress; mood and affect are within normal limits.   A focused examination was performed of the following areas:dcalp   Relevant exam findings are noted in the Assessment and Plan.  Diagnosis Skin , mid parietal scalp ACUTE FOLLICULITIS WITH PROMINENT INFLAMMATORY INFILTRATE, SEE DESCRIPTION Microscopic Description I note the clinical history. Dilated follicles with a neutrophilic infiltrate consistent with acute folliculitis are present, surrounding which is acute and chronic inflammatory change with histiocytes expanding the interstitium. The follicles are relatively intact. Fibrosis and zones of plasma cells are only focal. This pattern is most suggestive of that seen in a folliculitis perhaps related to a fungus such as kerion, but PAS staining is negative in these sections. An acute phase in a dissecting cellulitis or follicular occlusion of the scalp is possible but in that condition over time there is fibrosis and zones of plasma cells which are not  yet developed here. Most of the changes reflect the acute folliculitis with surrounding lymphohistiocytic inflammation, and not follicular rupture as would be expected in a dissecting cellulitis/follicular occlusion process. In this sense although I do not see organisms, I would consider treatment for a potential bacterial or fungal folliculitis first, before considering an early phase of a follicular occlusion process. (NDS:kh 08/01/22)   Assessment & Plan  Acute Folliculitis (?early dissecting cellulitis) Exam:   Treatment Plan:  Pathology report reviewed in detail with pt and mother (see report above) --> fungal stains were all negative Start doxycycline 100mg   at dinner time Start clindamycin lotion mixed withj tretinoin .0.025% every other night to face and scalp and told pt to use moisturizer if he gets too dry.   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 Informed consent was obtained. Preparation: The area was prepared a standard fashion.  Anesthesia:none  Procedure Details: An intralesional injection was performed with Kenalog 10 mg/cc. Marland Kitchen3 cc in total were injected.  Total number of injections: 7  Plan: The patient was instructed on post-op care. Recommend OTC analgesia as needed for pain.   triamcinolone acetonide (KENALOG) 10 MG/ML injection 10 mg - Scalp     Return in about 6 weeks (around 09/21/2022) for folliculitis .  Owens Shark, CMA, am acting as scribe for [employer], DO.   Documentation: I  have reviewed the above documentation for accuracy and completeness, and I agree with the above.  Langston Reusing, DO

## 2022-08-10 NOTE — Patient Instructions (Addendum)
Due to recent changes in healthcare laws, you may see results of your pathology and/or laboratory studies on MyChart before the doctors have had a chance to review them. We understand that in some cases there may be results that are confusing or concerning to you. Please understand that not all results are received at the same time and often the doctors may need to interpret multiple results in order to provide you with the best plan of care or course of treatment. Therefore, we ask that you please give us 2 business days to thoroughly review all your results before contacting the office for clarification. Should we see a critical lab result, you will be contacted sooner.   If You Need Anything After Your Visit  If you have any questions or concerns for your doctor, please call our main line at [phone] If no one answers, please leave a voicemail as directed and we will return your call as soon as possible. Messages left after 4 pm will be answered the following business day.   You may also send us a message via MyChart. We typically respond to MyChart messages within 1-2 business days.  For prescription refills, please ask your pharmacy to contact our office. Our fax number is [phone].  If you have an urgent issue when the clinic is closed that cannot wait until the next business day, you can page your doctor at the number below.    Please note that while we do our best to be available for urgent issues outside of office hours, we are not available 24/7.   If you have an urgent issue and are unable to reach us, you may choose to seek medical care at your doctor's office, retail clinic, urgent care center, or emergency room.  If you have a medical emergency, please immediately call 911 or go to the emergency department. In the event of inclement weather, please call our main line at [phone] for an update on the status of any delays or closures.  Dermatology Medication Tips: Please  keep the boxes that topical medications come in in order to help keep track of the instructions about where and how to use these. Pharmacies typically print the medication instructions only on the boxes and not directly on the medication tubes.   If your medication is too expensive, please contact our office at [phone] or send us a message through MyChart.   We are unable to tell what your co-pay for medications will be in advance as this is different depending on your insurance coverage. However, we may be able to find a substitute medication at lower cost or fill out paperwork to get insurance to cover a needed medication.   If a prior authorization is required to get your medication covered by your insurance company, please allow us 1-2 business days to complete this process.  Drug prices often vary depending on where the prescription is filled and some pharmacies may offer cheaper prices.  The website www.goodrx.com contains coupons for medications through different pharmacies. The prices here do not account for what the cost may be with help from insurance (it may be cheaper with your insurance), but the website can give you the price if you did not use any insurance.  - You can print the associated coupon and take it with your prescription to the pharmacy.  - You may also stop by our office during regular business hours and pick up a GoodRx coupon card.  - If you need your   prescription sent electronically to a different pharmacy, notify our office through [facility] MyChart or by phone at [phone]     

## 2022-08-11 ENCOUNTER — Telehealth: Payer: Self-pay

## 2022-08-11 NOTE — Transitions of Care (Post Inpatient/ED Visit) (Signed)
   08/11/2022  Name: Stephen Bruce MRN: [id number] DOB: [birth year]  Today's TOC FU Call Status: Today's TOC FU Call Status:: Unsuccessful Call (2nd Attempt) Unsuccessful Call (2nd Attempt) Date: 08/11/22  Attempted to reach the patient regarding the most recent Inpatient/ED visit.  Follow Up Plan: No further outreach attempts will be made at this time. We have been unable to contact the patient.  Signature YL,RMA

## 2022-08-15 ENCOUNTER — Ambulatory Visit: Payer: Medicaid Other | Admitting: Dermatology

## 2022-08-25 ENCOUNTER — Encounter: Payer: Self-pay | Admitting: Dermatology

## 2022-08-28 NOTE — Telephone Encounter (Signed)
Please call pt and let him know the irritation could be from the tretinoin cream we prescribed a the last visit.  Have him Stop the tretinoin and apply OTC hydrocortisone cream if he is experiencing any irritation or itching.

## 2022-09-13 ENCOUNTER — Ambulatory Visit (INDEPENDENT_AMBULATORY_CARE_PROVIDER_SITE_OTHER): Payer: Medicaid Other | Admitting: Mental Health

## 2022-09-13 DIAGNOSIS — F79 Unspecified intellectual disabilities: Secondary | ICD-10-CM

## 2022-09-13 DIAGNOSIS — F902 Attention-deficit hyperactivity disorder, combined type: Secondary | ICD-10-CM

## 2022-09-13 NOTE — Progress Notes (Signed)
Comprehensive Clinical Assessment (CCA) Note  09/14/2022 Stephen Bruce [id number]  Chief Complaint:  Chief Complaint  Patient presents with   Establish Care   Visit Diagnosis: ADHD by history, Unspecified intellectual disabilities   CCA Screening, Triage and Referral (STR)  Patient Reported Information How did you hear about [place]? Other (Comment)  Referral name: [facility]  Whom do you see for routine medical problems? Primary Care  Practice/Facility Name: [facility]  What Is the Reason for Your Visit/Call Today? "He was trying to do therapy but can't really get anything out of him but he needed to see a psychologist."  What Do You Feel Would Help You the Most Today? Treatment for Depression or other mood problem   Have You Recently Been in Any Inpatient Treatment (Hospital/Detox/Crisis Center/28-Day Program)? No  Have You Ever Received Services From [employer] [facility] Before? No   Have You Recently Had Any Thoughts About Hurting Yourself? No  Are You Planning to Commit Suicide/Harm Yourself At This time? No   Have you Recently Had Thoughts About Hurting Someone Stephen Bruce? No   Have You Used Any Alcohol or Drugs in the Past 24 Hours? No  What Did You Use and How Much? Na   Do You Currently Have a Therapist/Psychiatrist? No   Have You Been Recently Discharged From Any Office Practice or Programs? No     CCA Screening Triage Referral Assessment Type of Contact: Face-to-Face  Collateral Involvement: Mother denies concerns for mood  If Minor and Living with Parent(s), Who has Custody? No data recorded Is CPS involved or ever been involved? Never  Is APS involved or ever been involved? Never   Patient Determined To Be At Risk for Harm To Self or Others Based on Review of Patient Reported Information or Presenting Complaint? No  Method: No Plan  Availability of Means: No access or NA  Intent: Vague intent or NA  Notification Required: No need or  identified person  Are There Guns or Other Weapons in Your Home? No  Types of Guns/Weapons: NA   Who Could Verify You Are Able To Have These Secured: NA  Do You Have any Outstanding Charges, Pending Court Dates, Parole/Probation? Denies   Location of Assessment: [facility]   Does Patient Present under Involuntary Commitment? No   [place] of Residence: [place]   Patient Currently Receiving the Following Services: Not Receiving Services   Determination of Need: Routine (7 days)   Options For Referral: Medication Management     CCA Biopsychosocial Intake/Chief Complaint:  "I wanted him to see a therapist, he really is in the process of trying to get help. He can't work on his own. In the process of getting disability. When he was younger they said ADHD. When he was younger he couldn't concentrate." Stephen Bruce is a 22 year old AFrican-American single male who presents for routine assessment to engage in outpatient therapy services. Stephen Bruce presents with his mother, Stephen Bruce, who he gives verbal persmssion to be present for assessment and completes ROI. Mother provides collateral information and shares history of outpatient therapy services with [facility]; shares to have presented x 2 in which they reported need for psychologist. Notes diagnosis of ADHD in childhood and notes for him to have had an IEP and completed testing in school but unclear of what diagnose may have been.  Stephen Bruce denies concerns for mental health concerns and mother confirms.  Current Symptoms/Problems: Denies concerns for mood or behaviors. Inquires about a psychological  Patient Reported Schizophrenia/Schizoaffective Diagnosis in Past: No   Strengths: Supportive mother  Preferences: Psychological; employment  Abilities: -   Type of Services Patient Feels are Needed: Psychological evaluation   Initial Clinical Notes/Concerns: Unknown intellectual and/or developmental  disability   Mental Health Symptoms Depression:   None   Duration of Depressive symptoms: No data recorded  Mania:   None   Anxiety:    Worrying (notes over thinking at times)   Psychosis:   None   Duration of Psychotic symptoms: No data recorded  Trauma:   None   Obsessions:   None   Compulsions:   None   Inattention:   Poor follow-through on tasks; Does not follow instructions (not oppositional); Avoids/dislikes activities that require focus; Forgetful; Symptoms before age 20; Symptoms present in 2 or more settings; Disorganized   Hyperactivity/Impulsivity:   Fidgets with hands/feet; Feeling of restlessness   Oppositional/Defiant Behaviors:   None   Emotional Irregularity:   None   Other Mood/Personality Symptoms:  No data recorded   Mental Status Exam Appearance and self-care  Stature:   Average   Weight:   Average weight   Clothing:   Casual   Grooming:   Normal   Cosmetic use:   None   Posture/gait:   Slumped   Motor activity:   Not Remarkable   Sensorium  Attention:   Inattentive   Concentration:   Normal   Orientation:   X5   Recall/memory:   Defective in Short-term   Affect and Mood  Affect:   Appropriate   Mood:   Other (Comment) (adequate)   Relating  Eye contact:   Fleeting   Facial expression:   Responsive   Attitude toward examiner:   Cooperative   Thought and Language  Speech flow:  Paucity; Slow; Soft   Thought content:   Appropriate to Mood and Circumstances   Preoccupation:   None   Hallucinations:   None   Organization:  No data recorded  [employer] of Knowledge:   Fair   Intelligence:   Needs investigation   Abstraction:   Normal   Judgement:   Good   Reality Testing:   Realistic   Insight:   Unaware   Decision Making:   Only simple   Social Functioning  Social Maturity:   Responsible   Social Judgement:   Normal   Stress  Stressors:  No data  recorded  Coping Ability:   Normal   Skill Deficits:   Intellect/education   Supports:   Family; Church     Religion: Religion/Spirituality Are You A Religious Person?: Yes  Leisure/Recreation: Leisure / Recreation Do You Have Hobbies?: Yes Leisure and Hobbies: play games-video games  Exercise/Diet: Exercise/Diet Do You Exercise?: No Have You Gained or Lost A Significant Amount of Weight in the Past Six Months?: No Do You Follow a Special Diet?: No Do You Have Any Trouble Sleeping?: Yes Explanation of Sleeping Difficulties: sleep apnea   CCA Employment/Education Employment/Work Situation: Employment / Work Situation Employment Situation: Employed (Working on gaining disability) Where is Patient Currently Employed?: Designer, jewellery- bagger How Long has Patient Been Employed?: 2021 Are You Satisfied With Your Job?: Yes Do You Work More Than One Job?: No Work Stressors: denies What is the Longest Time Patient has Held a Job?: 2 Where was the Patient Employed at that Time?: Current position Has Patient ever Been in the [employer]?: No  Education: Education Is Patient Currently Attending School?: No Last Grade Completed: 12  Did You Graduate From [employer]?: Yes Did You Attend College?: No Did You Attend Graduate School?: No Did You Have An Individualized Education Program (IIEP): Yes (Shares to have had several testing done in school. Was in special education courses) Did You Have Any Difficulty At School?: No Patient's Education Has Been Impacted by Current Illness: No   CCA Family/Childhood History Family and Relationship History: Family history Marital status: Single Are you sexually active?: No What is your sexual orientation?: heterosexual Does patient have children?: No  Childhood History:  Childhood History By whom was/is the patient raised?: Mother Additional childhood history information: Shares to have been raised by his mother; raised in  [place]. Describes his childhood as "good." Description of patient's relationship with caregiver when they were a child: Mother: "good" Father: "good" Patient's description of current relationship with people who raised him/her: Mother: "good." Father: "good" How were you disciplined when you got in trouble as a child/adolescent?: - Does patient have siblings?: Yes Number of Siblings: 1 (x 1 older sister) Description of patient's current relationship with siblings: Shares to get along with sister well Did patient suffer any verbal/emotional/physical/sexual abuse as a child?: No Did patient suffer from severe childhood neglect?: No Has patient ever been sexually abused/assaulted/raped as an adolescent or adult?: No Was the patient ever a victim of a crime or a disaster?: No Witnessed domestic violence?: No Has patient been affected by domestic violence as an adult?: No  Child/Adolescent Assessment:     CCA Substance Use Alcohol/Drug Use: Alcohol / Drug Use Over the Counter: Vitamin D History of alcohol / drug use?: No history of alcohol / drug abuse                         ASAM's:  Six Dimensions of Multidimensional Assessment  Dimension 1:  Acute Intoxication and/or Withdrawal Potential:      Dimension 2:  Biomedical Conditions and Complications:      Dimension 3:  Emotional, Behavioral, or Cognitive Conditions and Complications:     Dimension 4:  Readiness to Change:     Dimension 5:  Relapse, Continued use, or Continued Problem Potential:     Dimension 6:  Recovery/Living Environment:     ASAM Severity Score:    ASAM Recommended Level of Treatment:     Substance use Disorder (SUD)    Recommendations for Services/Supports/Treatments:    DSM5 Diagnoses: Patient Active Problem List   Diagnosis Date Noted   Unspecified intellectual disabilities 09/14/2022   ADHD (attention deficit hyperactivity disorder), combined type 07/22/2015   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 Vaishnav presents with his mother, Stephen Bruce, who he gives verbal permission to be present for assessment and completes ROI. Mother provides collateral information and shares history of outpatient therapy services with [facility]; shares to have presented x 2 in which they reported need for psychologist. Notes diagnosis of ADHD in childhood and notes for him to have had an IEP and completed testing in school but unclear of what diagnosis may have been. Jarid denies concerns for mental health concerns and mother confirms.   Redmond presents for assessment alert  and oriented; mood and affect blunted; neutral. Speech clear and coherent at slow rate and low tone. Minimal engagement with therapist; limited engagement. Unable to report to therapist independently reason for assessment and desire for behavioral health services. Not able to complete paperwork for intake independently. Mother provides collateral information which is also minimal in nature. Mother states to be in process of securing legal guardianship over Idanha. Kaesyn denies current stressors with mother affirming. Rosalio denies current concerns for depressive moods; shares some episodes of excessive worry but denies other anxiety sxs or anxiety or panic attacks occurring. Denies mood swings or behavioral concerns. Denies psychosis; denies trauma events. No concerns for anger reported. Shares hx of ADHD dx in childhood with mother sharing as early as daycare and all throughout education for it to have been reported of concerns and testing done throughout; however denies to be  aware of specified testing that was done or results of testing. States to have held IEP in school. Mother reported for Elizardo to have a job coach that helps with employment, but unable to recall agency that provides this service. Denies use of substances. Denies legal concerns. Lives at home with mother. Currently works part-time at [employer] as a Merchandiser, retail. Mother shares historically difficulty obtaining work. No behavioral or mental health concerns reported. Denies SI/HI/AVH. CSSRS, pain, nutrition, GAD and PHQ completed.   PHQ: 5  Therapist discussed ability to secure records and results of testing from school pt attended and likelihood of job coach agency to also have record of current diagnoses. Encouraged to reach out to [facility] as well for need for possible IDD care coordination to gain connection to available services in which he would benefit from. OPT not an appropriate service for client.   No follow up Patient Centered Plan: Patient is on the following Treatment Plan(s):  Anxiety   Referrals to Alternative Service(s): Referred to Alternative Service(s):   Place:   Date:   Time:    Referred to Alternative Service(s):   Place:   Date:   Time:    Referred to Alternative Service(s):   Place:   Date:   Time:    Referred to Alternative Service(s):   Place:   Date:   Time:      Collaboration of Care: Other Referral for Psychological evaluation and case management with [facility] for IDD care coordination  Patient/Guardian was advised Release of Information must be obtained prior to any record release in order to collaborate their care with an outside provider. Patient/Guardian was advised if they have not already done so to contact the registration department to sign all necessary forms in order for [place] to release information regarding their care.   Consent: Patient/Guardian gives verbal consent for treatment and assignment of benefits for services provided during this visit.  Patient/Guardian expressed understanding and agreed to proceed.   Dorris Singh, [facility]

## 2022-09-14 DIAGNOSIS — F79 Unspecified intellectual disabilities: Secondary | ICD-10-CM | POA: Insufficient documentation

## 2022-09-27 ENCOUNTER — Encounter: Payer: Self-pay | Admitting: Dermatology

## 2022-09-27 ENCOUNTER — Ambulatory Visit (INDEPENDENT_AMBULATORY_CARE_PROVIDER_SITE_OTHER): Payer: Medicaid Other | Admitting: Dermatology

## 2022-09-27 VITALS — BP 102/61 | HR 80

## 2022-09-27 DIAGNOSIS — L739 Follicular disorder, unspecified: Secondary | ICD-10-CM

## 2022-09-27 MED ORDER — TRIAMCINOLONE ACETONIDE 10 MG/ML IJ SUSP
10.0000 mg | Freq: Once | INTRAMUSCULAR | Status: AC
Start: 2022-09-27 — End: 2022-09-27
  Administered 2022-09-27: 10 mg

## 2022-09-27 MED ORDER — DOXYCYCLINE HYCLATE 100 MG PO CAPS: ORAL_CAPSULE | ORAL | 3 refills | Status: DC

## 2022-09-27 NOTE — Patient Instructions (Addendum)
Dear Stephen Bruce,  Thank you for visiting [place] again and for your dedication to improving your health. It's great to hear that your condition has improved since our last appointment. Here's a summary of the key instructions from today's visit:  - Medication Adjustments:   - A new prescription for Doxycycline will be sent to you with instructions to use only in case of a flare-up, for a duration of one week. Three refills have been authorized.  - Treatment Procedure:   - Administered Kenalog injections (0.3 cc's total) to help reduce inflammation and promote hair growth.  - Follow-Up Care:   - Continue using DHS zinc shampoo as part of your regular scalp care routine.   - Scheduled a follow-up appointment in three months, or sooner if you experience a flare-up that does not resolve with Doxycycline.  Please keep the prescribed medication on hand for emergency use, especially over weekends when our office is closed. If you have any questions or concerns before your next appointment, do not hesitate to contact us.  Best regards,  Dr. Langston Reusing,        Due to recent changes in healthcare laws, you may see results of your pathology and/or laboratory studies on MyChart before the doctors have had a chance to review them. We understand that in some cases there may be results that are confusing or concerning to you. Please understand that not all results are received at the same time and often the doctors may need to interpret multiple results in order to provide you with the best plan of care or course of treatment. Therefore, we ask that you please give [place] 2 business days to thoroughly review all your results before contacting the office for clarification. Should we see a critical lab result, you will be contacted sooner.   If You Need Anything After Your Visit  If you have any questions or concerns for your doctor, please call our main line at [phone] If no one answers, please leave a  voicemail as directed and we will return your call as soon as possible. Messages left after 4 pm will be answered the following business day.   You may also send [place] a message via MyChart. We typically respond to MyChart messages within 1-2 business days.  For prescription refills, please ask your pharmacy to contact our office. Our fax number is [phone].  If you have an urgent issue when the clinic is closed that cannot wait until the next business day, you can page your doctor at the number below.    Please note that while we do our best to be available for urgent issues outside of office hours, we are not available 24/7.   If you have an urgent issue and are unable to reach [place], you may choose to seek medical care at your doctor's office, retail clinic, urgent care center, or emergency room.  If you have a medical emergency, please immediately call 911 or go to the emergency department. In the event of inclement weather, please call our main line at [phone] for an update on the status of any delays or closures.  Dermatology Medication Tips: Please keep the boxes that topical medications come in in order to help keep track of the instructions about where and how to use these. Pharmacies typically print the medication instructions only on the boxes and not directly on the medication tubes.   If your medication is too expensive, please contact our office at [phone] or send [place]  a message through MyChart.   We are unable to tell what your co-pay for medications will be in advance as this is different depending on your insurance coverage. However, we may be able to find a substitute medication at lower cost or fill out paperwork to get insurance to cover a needed medication.   If a prior authorization is required to get your medication covered by your insurance company, please allow [place] 1-2 business days to complete this process.  Drug prices often vary depending on where the  prescription is filled and some pharmacies may offer cheaper prices.  The website www.goodrx.com contains coupons for medications through different pharmacies. The prices here do not account for what the cost may be with help from insurance (it may be cheaper with your insurance), but the website can give you the price if you did not use any insurance.  - You can print the associated coupon and take it with your prescription to the pharmacy.  - You may also stop by our office during regular business hours and pick up a GoodRx coupon card.  - If you need your prescription sent electronically to a different pharmacy, notify our office through [facility] or by phone at [phone]

## 2022-09-27 NOTE — Progress Notes (Signed)
   Follow-Up Visit   Subjective  Stephen Bruce is a 22 y.o. male who presents for the following: folliculitis Patient present today for follow up visit for folliculitis. Patient was last evaluated on 08/10/22. Patient reports things are Better, spots smaller after kenalog injections.  Patient reports no medication changes.    The following portions of the chart were reviewed this encounter and updated as appropriate: medications, allergies, medical history  Review of Systems:  No other skin or systemic complaints except as noted in HPI or Assessment and Plan.  Objective  Well appearing patient in no apparent distress; mood and affect are within normal limits.   A focused examination was performed of the following areas: scalp  Relevant exam findings are noted in the Assessment and Plan.  Scalp Exam: Perifollicular erythematous papules and pustules         Assessment & Plan      Folliculitis (Dissecting Cellulitis)   - Continue using DHS zinc shampoo as part of your regular scalp care routine.   - Scheduled a follow-up appointment in three months, or sooner if you experience a flare-up that does not resolve with Doxycycline.  Please keep the prescribed medication on hand for emergency use, especially over weekends when our office is closed. If you have any questions or concerns before your next appointment, do not hesitate to contact us.   Folliculitis Scalp  Procedure Note Intralesional Injection  Location: scalp  Informed Consent: Discussed risks (infection, pain, bleeding, bruising, thinning of the skin, loss of skin pigment, lack of resolution, and recurrence of lesion) and benefits of the procedure, as well as the alternatives. Informed consent was obtained. Preparation: The area was prepared a standard fashion.  Anesthesia: N/A  Procedure Details: An intralesional injection was performed with Kenalog 10 mg/cc. . 3cc in total were injected. NDC #:  1610-9604-54 Exp: 04/19/24  Total number of injections: 1  Plan: The patient was instructed on post-op care. Recommend OTC analgesia as needed for pain.   Related Medications triamcinolone acetonide (KENALOG) 10 MG/ML injection 10 mg    Return in about 3 months (around 12/28/2022) for folliculitis.  Owens Shark, CMA, am acting as scribe for [employer], DO.   Documentation: I have reviewed the above documentation for accuracy and completeness, and I agree with the above.  Langston Reusing, DO

## 2022-09-28 ENCOUNTER — Encounter: Payer: Medicaid Other | Admitting: Nurse Practitioner

## 2022-10-04 DIAGNOSIS — G4733 Obstructive sleep apnea (adult) (pediatric): Secondary | ICD-10-CM | POA: Diagnosis not present

## 2022-10-04 DIAGNOSIS — G4710 Hypersomnia, unspecified: Secondary | ICD-10-CM | POA: Diagnosis not present

## 2022-10-25 ENCOUNTER — Encounter: Payer: Self-pay | Admitting: Family Medicine

## 2022-10-25 ENCOUNTER — Ambulatory Visit (INDEPENDENT_AMBULATORY_CARE_PROVIDER_SITE_OTHER): Payer: Medicaid Other | Admitting: Family Medicine

## 2022-10-25 VITALS — BP 116/78 | HR 71 | Temp 98.0°F | Ht 64.0 in | Wt 170.0 lb

## 2022-10-25 DIAGNOSIS — F79 Unspecified intellectual disabilities: Secondary | ICD-10-CM

## 2022-10-25 DIAGNOSIS — Z0000 Encounter for general adult medical examination without abnormal findings: Secondary | ICD-10-CM

## 2022-10-25 DIAGNOSIS — E559 Vitamin D deficiency, unspecified: Secondary | ICD-10-CM | POA: Diagnosis not present

## 2022-10-25 DIAGNOSIS — Z2821 Immunization not carried out because of patient refusal: Secondary | ICD-10-CM

## 2022-10-25 NOTE — Progress Notes (Signed)
I,Jameka J Llittleton, CMA,acting as a Neurosurgeon for [employer], NP.,have documented all relevant documentation on the behalf of Ellender Hose, NP,as directed by  Ellender Hose, NP while in the presence of Ellender Hose, NP.  Subjective:   Patient ID: Stephen Bruce , male    DOB: 10-22-2000 , 22 y.o.   MRN: [id number]  Chief Complaint  Patient presents with   Annual Exam    HPI  Patient is a 22 year old male who presents today for a physical. Patient is here with his mother today. Patient 's mother would like him to be tested for intellectual disability so that he can get proper help and resources. Mom states he goes to therapy but he is not able to follow instructions like a grown up adult should and also he cannot get a job like an adult should because of his mental state.Mom states that as a young child he was usually treated for ADHD but growing up he seem to be showing more  of mental retardation than ADHD and she would want him to be evaluated.     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 Apply to scalp and face every other night mixed with tretinoin, Disp: 60 mL, Rfl: 0   ibuprofen (ADVIL) 600 MG tablet, Take 1 tablet (600 mg total) by mouth every 6 (six) hours as needed., Disp: 30 tablet, Rfl: 0   loratadine (CLARITIN) 10 MG tablet, TAKE 1 TABLET (10 MG TOTAL) BY MOUTH DAILY., Disp: 30 tablet, Rfl: 0   Misc. Devices MISC, New cpap machine on current settings and supplies.  DME: [employer], Disp: , Rfl:    Vitamin D, Ergocalciferol, (DRISDOL) 1.25 MG (50000 UNIT) CAPS capsule, Take 1 capsule (50,000 Units  total) by mouth 2 (two) times a week., Disp: 24 capsule, Rfl: 1   doxycycline (VIBRA-TABS) 100 MG tablet, Take 1 tablet (100 mg total) by mouth 2 (two) times daily for 10 days. TAKE WITH HEAVY MEAL, Disp: 20 tablet, Rfl: 0   Allergies  Allergen Reactions   Bactrim [Sulfamethoxazole-Trimethoprim] Rash    Pt broke out in rash and developed high fever and headache   Shellfish Allergy Other (See Comments)    POSITIVE ON ALLERGY TESTING   Shellfish-Derived Products Other (See Comments)    POSITIVE ON ALLERGY TESTING     Men's preventive visit. Patient Health Questionnaire (PHQ-2) is  Flowsheet Row Office Visit from 10/25/2022 in [facility]  PHQ-2 Total Score 0     .  Social History   Substance and Sexual Activity  Alcohol Use No  . Relevant history for tobacco use is:  Social History   Tobacco Use  Smoking Status Never  Smokeless Tobacco Never  .   Review of Systems  Constitutional: Negative.  Negative for appetite change.  HENT: Negative.  Negative for congestion, drooling and mouth sores.   Eyes: Negative.   Respiratory: Negative.  Negative for cough and shortness of breath.   Cardiovascular: Negative.   Gastrointestinal:  Negative.   Endocrine: Negative.   Genitourinary: Negative.   Musculoskeletal:  Positive for gait problem.  Skin: Negative.   Neurological:  Positive for speech difficulty.  Hematological: Negative.   Psychiatric/Behavioral:  Negative for agitation.      Today's Vitals   10/25/22 1503  BP: 116/78  Pulse: 71  Temp: 98 F (36.7 C)  SpO2: 99%  Weight: 170 lb (77.1 kg)  Height: 5\' 4"  (1.626 m)  PainSc: 0-No pain   Body mass index is 29.18 kg/m.  Wt Readings from Last 3 Encounters:  10/25/22 170 lb (77.1 kg)  08/03/22 175 lb (79.4 kg)  05/22/22 173 lb (78.5 kg)    Objective:  Physical Exam HENT:     Head: Normocephalic.     Right Ear: Tympanic membrane normal.     Nose: No congestion or rhinorrhea.      Mouth/Throat:     Pharynx: No posterior oropharyngeal erythema.  Cardiovascular:     Rate and Rhythm: Normal rate.     Pulses: Normal pulses.  Pulmonary:     Effort: Pulmonary effort is normal.     Breath sounds: Normal breath sounds.  Abdominal:     General: Bowel sounds are normal.  Musculoskeletal:     Cervical back: Normal range of motion.  Skin:    General: Skin is warm and dry.  Neurological:     Mental Status: He is alert. Mental status is at baseline.  Psychiatric:        Behavior: Behavior is slowed.        Cognition and Memory: Cognition is impaired.         Assessment And Plan:    Encounter for general adult medical examination w/o abnormal findings -     CMP14+EGFR -     CBC with Differential/Platelet  Influenza vaccination declined  Intellectual disability -     Ambulatory referral to Psychology  Vitamin D deficiency -     VITAMIN D 25 Hydroxy (Vit-D Deficiency, Fractures)     Return for 1 year physical. Patient was given opportunity to ask questions. Patient verbalized understanding of the plan and was able to repeat key elements of the plan. All questions were answered to their satisfaction.   Ellender Hose, NP  I, Ellender Hose, NP, have reviewed all documentation for this visit. The documentation on 11/02/22 for the exam, diagnosis, procedures, and orders are all accurate and complete.

## 2022-10-26 LAB — CMP14+EGFR
ALT: 12 IU/L (ref 0–44)
AST: 17 IU/L (ref 0–40)
Albumin: 4.6 g/dL (ref 4.3–5.2)
Alkaline Phosphatase: 71 IU/L (ref 44–121)
BUN/Creatinine Ratio: 12 (ref 9–20)
BUN: 11 mg/dL (ref 6–20)
Bilirubin Total: 0.5 mg/dL (ref 0.0–1.2)
CO2: 28 mmol/L (ref 20–29)
Calcium: 9.7 mg/dL (ref 8.7–10.2)
Chloride: 103 mmol/L (ref 96–106)
Creatinine, Ser: 0.93 mg/dL (ref 0.76–1.27)
Globulin, Total: 2.3 g/dL (ref 1.5–4.5)
Glucose: 76 mg/dL (ref 70–99)
Potassium: 4.5 mmol/L (ref 3.5–5.2)
Sodium: 142 mmol/L (ref 134–144)
Total Protein: 6.9 g/dL (ref 6.0–8.5)
eGFR: 119 mL/min/{1.73_m2} (ref >59)

## 2022-10-26 LAB — CBC WITH DIFFERENTIAL/PLATELET
Basophils Absolute: 0 10*3/uL (ref 0.0–0.2)
Basos: 0 %
EOS (ABSOLUTE): 0 10*3/uL (ref 0.0–0.4)
Eos: 1 %
Hematocrit: 43 % (ref 37.5–51.0)
Hemoglobin: 14.6 g/dL (ref 13.0–17.7)
Immature Grans (Abs): 0 10*3/uL (ref 0.0–0.1)
Immature Granulocytes: 0 %
Lymphocytes Absolute: 1.8 10*3/uL (ref 0.7–3.1)
Lymphs: 32 %
MCH: 30.4 pg (ref 26.6–33.0)
MCHC: 34 g/dL (ref 31.5–35.7)
MCV: 90 fL (ref 79–97)
Monocytes Absolute: 0.6 10*3/uL (ref 0.1–0.9)
Monocytes: 10 %
Neutrophils Absolute: 3.1 10*3/uL (ref 1.4–7.0)
Neutrophils: 57 %
Platelets: 173 10*3/uL (ref 150–450)
RBC: 4.8 x10E6/uL (ref 4.14–5.80)
RDW: 11.3 % — ABNORMAL LOW (ref 11.6–15.4)
WBC: 5.5 10*3/uL (ref 3.4–10.8)

## 2022-10-26 LAB — VITAMIN D 25 HYDROXY (VIT D DEFICIENCY, FRACTURES): Vit D, 25-Hydroxy: 69.3 ng/mL (ref 30.0–100.0)

## 2022-11-01 ENCOUNTER — Encounter: Payer: Self-pay | Admitting: Dermatology

## 2022-11-01 NOTE — Telephone Encounter (Signed)
We can send an rx for Doxy 100mg  PO BID for 10 days.  Thanks!

## 2022-11-02 ENCOUNTER — Other Ambulatory Visit: Payer: Self-pay

## 2022-11-02 DIAGNOSIS — Z0000 Encounter for general adult medical examination without abnormal findings: Secondary | ICD-10-CM | POA: Insufficient documentation

## 2022-11-02 DIAGNOSIS — L729 Follicular cyst of the skin and subcutaneous tissue, unspecified: Secondary | ICD-10-CM

## 2022-11-02 DIAGNOSIS — Z2821 Immunization not carried out because of patient refusal: Secondary | ICD-10-CM | POA: Insufficient documentation

## 2022-11-02 DIAGNOSIS — E559 Vitamin D deficiency, unspecified: Secondary | ICD-10-CM | POA: Insufficient documentation

## 2022-11-02 MED ORDER — DOXYCYCLINE HYCLATE 100 MG PO TABS: 100.0000 mg | ORAL_TABLET | Freq: Two times a day (BID) | ORAL | 0 refills | Status: AC

## 2022-11-16 ENCOUNTER — Ambulatory Visit: Payer: Medicaid Other | Admitting: Podiatry

## 2022-11-16 ENCOUNTER — Ambulatory Visit: Payer: Medicaid Other

## 2022-11-16 ENCOUNTER — Encounter: Payer: Self-pay | Admitting: Podiatry

## 2022-11-16 DIAGNOSIS — M7752 Other enthesopathy of left foot: Secondary | ICD-10-CM

## 2022-11-16 DIAGNOSIS — M778 Other enthesopathies, not elsewhere classified: Secondary | ICD-10-CM

## 2022-11-16 MED ORDER — MELOXICAM 15 MG PO TABS: 15.0000 mg | ORAL_TABLET | Freq: Every day | ORAL | 3 refills | Status: AC

## 2022-11-16 MED ORDER — TRIAMCINOLONE ACETONIDE 40 MG/ML IJ SUSP
20.0000 mg | Freq: Once | INTRAMUSCULAR | Status: AC
Start: 2022-11-16 — End: 2022-11-16
  Administered 2022-11-16: 20 mg

## 2022-11-16 MED ORDER — METHYLPREDNISOLONE 4 MG PO TBPK: ORAL_TABLET | ORAL | 0 refills | Status: DC

## 2022-11-16 NOTE — Telephone Encounter (Signed)
Hi Jetta,  Let's get him on the schedule for this afternoon or early next week for ILK.  Thanks!

## 2022-11-19 NOTE — Progress Notes (Signed)
He presents today chief complaint of pain to his left foot.  States that hurts right here as he points to the sinus tarsi of the left foot.  Objective: Vital signs are stable alert oriented x 3.  Has tenderness on range of motion and attempted range of motion of the subtalar joint and on palpation of the sinus tarsi.  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  No other osseous abnormalities in the area.  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  Plan: I injected the area today started him on methylprednisolone and meloxicam.  We injected him with 10 mg Kenalog 5 mg Marcaine point of maximal tenderness.  I will follow-up with him in 1 month questions concerns he will notify us immediately in 1 month if he is no longer in pain he will cancel the appointment.

## 2022-11-22 ENCOUNTER — Ambulatory Visit: Payer: Medicaid Other | Admitting: Dermatology

## 2022-11-22 ENCOUNTER — Encounter: Payer: Self-pay | Admitting: Dermatology

## 2022-11-22 VITALS — BP 100/69

## 2022-11-22 DIAGNOSIS — L739 Follicular disorder, unspecified: Secondary | ICD-10-CM

## 2022-11-22 DIAGNOSIS — L723 Sebaceous cyst: Secondary | ICD-10-CM

## 2022-11-22 MED ORDER — CLOBETASOL PROPIONATE 0.05 % EX SOLN: 1.0000 | Freq: Two times a day (BID) | CUTANEOUS | 1 refills | Status: AC

## 2022-11-22 MED ORDER — TRIAMCINOLONE ACETONIDE 40 MG/ML IJ SUSP
40.0000 mg | Freq: Once | INTRAMUSCULAR | Status: AC
Start: 2022-11-22 — End: 2022-11-22
  Administered 2022-11-22: 40 mg via INTRADERMAL

## 2022-11-22 NOTE — Progress Notes (Signed)
   Follow-Up Visit   Subjective  Stephen Bruce is a 22 y.o. male who presents for the following:  The patient, Stephen Bruce, presents with a chief complaint of a recurrent flare of scalp folliculitis, which has not improved with doxycycline. He reports that the condition was initially improving, but then worsened again. The patient denies having used a liquid solution called clobetasol in the past. He has been using clindamycin lotion and doxycycline for the affected area. The patient also mentions having used tretinoin in the past, which caused red bumps around the affected area, leading to its discontinuation.  The patient's mother reports that Stephen Bruce was applying both clindamycin and tretinoin on the affected area, but stopped using tretinoin after experiencing red bumps. She also describes the current cyst as a hard, round knot, different from the previous fluid-filled presentation.    The following portions of the chart were reviewed this encounter and updated as appropriate: medications, allergies, medical history  Review of Systems:  No other skin or systemic complaints except as noted in HPI or Assessment and Plan.  Objective  Well appearing patient in no apparent distress; mood and affect are within normal limits.   A focused examination was performed of the following areas: scalp - Scalp: Normal appearance with hair regrowth noted in previously affected areas.   - Cystic Nodule: A 3 cm subcutaneous cystic nodule present, described as firm and round, similar to a knot.   - Inflammation: Subtle inflammation and bogginess noted around the cystic area.   Relevant exam findings are noted in the Assessment and Plan.  Right post scalp 3.0 cm SQ cystic nodule       Assessment & Plan   1. Folliculitis with possible early dissecting cellulitis  - Plan: Continue DHS Zinc shampoo for scalp hygiene. Continue Doxycycline for flares. Add Clobetasol solution for flares, apply  twice a day for 2-3 weeks during flare-ups. Continue Clindamycin lotion as previously prescribed.  2. Subcutaneous cystic nodule on the scalp  - Plan: Inject with Kenalog 40 to reduce inflammation and size. Schedule surgical excision with Dr. Caralyn Guile for complete removal of the cyst.   PROCEDURE NOTE Inflamed sebaceous cyst Right post scalp  Will plan excision with Dr Caralyn Guile  Intralesional injection - Right post scalp Location: right post scalp  Informed Consent: Discussed risks (infection, pain, bleeding, bruising, thinning of the skin, loss of skin pigment, lack of resolution, and recurrence of lesion) and benefits of the procedure, as well as the alternatives. Informed consent was obtained. Preparation: The area was prepared a standard fashion.  Anesthesia: none  Procedure Details: An intralesional injection was performed with Kenalog 40 mg/cc. 0.15 cc in total were injected.  Total number of injections: 1  Plan: The patient was instructed on post-op care. Recommend OTC analgesia as needed for pain.   triamcinolone acetonide (KENALOG-40) injection 40 mg - Right post scalp    Folliculitis (Dissecting Cellulitis)   - Continue using DHS zinc shampoo as part of your regular scalp care routine. -Start Clobetasol solution bid to affected areas of scalp x 2-3 weeks as needed for flares.  Return for Surgery cyst of right post scalp - with Dr Caralyn Guile.  I, Stephen Bruce, CMA, am acting as scribe for [employer], DO .   Documentation: I have reviewed the above documentation for accuracy and completeness, and I agree with the above.  Stephen Reusing, DO

## 2022-11-22 NOTE — Patient Instructions (Signed)

## 2022-11-23 DIAGNOSIS — G4733 Obstructive sleep apnea (adult) (pediatric): Secondary | ICD-10-CM | POA: Diagnosis not present

## 2022-11-23 DIAGNOSIS — G4710 Hypersomnia, unspecified: Secondary | ICD-10-CM | POA: Diagnosis not present

## 2022-12-12 ENCOUNTER — Telehealth: Payer: Self-pay | Admitting: Dermatology

## 2022-12-12 NOTE — Telephone Encounter (Signed)
Called patient for rescheduling of appointment on 10.24.24 , patient appointment will need to be scheduled on a different day due to change of schedule.

## 2022-12-14 ENCOUNTER — Ambulatory Visit: Payer: Medicaid Other | Admitting: Dermatology

## 2022-12-21 ENCOUNTER — Ambulatory Visit: Payer: Medicaid Other | Admitting: Dermatology

## 2022-12-21 ENCOUNTER — Ambulatory Visit: Payer: Medicaid Other | Admitting: Podiatry

## 2022-12-21 ENCOUNTER — Encounter: Payer: Self-pay | Admitting: Dermatology

## 2022-12-21 VITALS — BP 122/71

## 2022-12-21 DIAGNOSIS — L720 Epidermal cyst: Secondary | ICD-10-CM | POA: Diagnosis not present

## 2022-12-21 DIAGNOSIS — D492 Neoplasm of unspecified behavior of bone, soft tissue, and skin: Secondary | ICD-10-CM

## 2022-12-21 DIAGNOSIS — D485 Neoplasm of uncertain behavior of skin: Secondary | ICD-10-CM

## 2022-12-21 NOTE — Progress Notes (Addendum)
Follow-Up Visit   Subjective  Stephen Bruce is a 22 y.o. male who presents for the following: Excision of neoplasm of skin on right posterior cyst. Lesion has been there for several years, has previously drained and gotten inflamed. Mother with patient today.   The following portions of the chart were reviewed this encounter and updated as appropriate: medications, allergies, medical history  Review of Systems:  No other skin or systemic complaints except as noted in HPI or Assessment and Plan.  Objective  Well appearing patient in no apparent distress; mood and affect are within normal limits.  A focused examination was performed of the following areas: Right posterior scalp Relevant physical exam findings are noted in the Assessment and Plan.   right posterior scalp       Assessment & Plan   Neoplasm of uncertain behavior of skin right posterior scalp  Skin excision  Lesion length (cm):  3 Lesion width (cm):  2.8 Margin per side (cm):  0.1 Total excision diameter (cm):  3.2 Timeout: patient name, date of birth, surgical site, and procedure verified   Procedure prep:  Patient was prepped and draped in usual sterile fashion Prep type:  Chlorhexidine Anesthesia: the lesion was anesthetized in a standard fashion   Anesthetic:  1% lidocaine w/ epinephrine 1-100,000 local infiltration Instrument used: #15 blade   Hemostasis achieved with: suture, pressure and electrodesiccation   Hemostasis achieved with comment:  3.0 vicryl, staples Outcome: patient tolerated procedure well with no complications   Post-procedure details: wound care instructions given   Additional details:  Final length 4.8  Skin repair Complexity:  Complex Final length (cm):  4.8 Informed consent: discussed and consent obtained   Timeout: patient name, date of birth, surgical site, and procedure verified   Procedure prep:  Patient was prepped and draped in usual sterile fashion Prep type:   Chlorhexidine Anesthesia: the lesion was anesthetized in a standard fashion   Anesthetic:  1% lidocaine w/ epinephrine 1-100,000 local infiltration Reason for type of repair: reduce tension to allow closure, reduce the risk of dehiscence, infection, and necrosis, allow closure of the large defect, preserve normal anatomy and avoid adjacent structures   Undermining: area extensively undermined   Subcutaneous layers (deep stitches):  Suture size:  3-0 Suture type: Vicryl (polyglactin 910)   Stitches:  Buried vertical mattress Fine/surface layer approximation (top stitches):  Suture type: staples Hemostasis achieved with: pressure and electrodesiccation Outcome: patient tolerated procedure well with no complications   Post-procedure details: sterile dressing applied and wound care instructions given   Dressing type: pressure dressing and bandage    Specimen 1 - Surgical pathology Differential Diagnosis: R/O cyst vs lipoma vs other   Check Margins: No   Return in about 1 week (around 12/28/2022) for follow up.  The surgical wound was then cleaned, prepped, and re-anesthetized as above. Wound edges were undermined extensively along at least one entire edge and at a distance equal to or greater than the width of the defect (see wound defect size above) in order to achieve closure and decrease wound tension and anatomic distortion. Redundant tissue repair including standing cone removal was performed. Hemostasis was achieved with electrocautery. Subcutaneous and epidermal tissues were approximated with the above sutures. The surgical site was then lightly scrubbed with sterile, saline-soaked gauze.  The area was then bandaged using Vaseline ointment, non-adherent gauze, gauze pads, and tape to provide an adequate pressure dressing. The patient tolerated the procedure well, was given detailed written and  verbal wound care instructions, and was discharged in good condition.   The patient will  follow-up: 1 week.   I, Tillie Fantasia, CMA, am acting as scribe for Gwenith Daily, MD.   Documentation: I have reviewed the above documentation for accuracy and completeness, and I agree with the above.  Gwenith Daily, MD

## 2022-12-21 NOTE — Patient Instructions (Signed)

## 2022-12-25 LAB — SURGICAL PATHOLOGY

## 2022-12-28 ENCOUNTER — Encounter: Payer: Self-pay | Admitting: Dermatology

## 2022-12-28 ENCOUNTER — Ambulatory Visit: Payer: Medicaid Other | Admitting: Dermatology

## 2022-12-28 DIAGNOSIS — L905 Scar conditions and fibrosis of skin: Secondary | ICD-10-CM

## 2022-12-28 DIAGNOSIS — Z48817 Encounter for surgical aftercare following surgery on the skin and subcutaneous tissue: Secondary | ICD-10-CM

## 2022-12-28 DIAGNOSIS — L723 Sebaceous cyst: Secondary | ICD-10-CM

## 2022-12-28 DIAGNOSIS — L739 Follicular disorder, unspecified: Secondary | ICD-10-CM

## 2022-12-28 DIAGNOSIS — L729 Follicular cyst of the skin and subcutaneous tissue, unspecified: Secondary | ICD-10-CM

## 2022-12-28 DIAGNOSIS — L720 Epidermal cyst: Secondary | ICD-10-CM

## 2022-12-28 MED ORDER — DOXYCYCLINE HYCLATE 100 MG PO TABS
100.0000 mg | ORAL_TABLET | Freq: Every day | ORAL | 2 refills | Status: AC
Start: 2022-12-28 — End: 2023-03-28

## 2022-12-28 NOTE — Progress Notes (Signed)
   Follow-Up Visit   Subjective  Stephen Bruce is a 22 y.o. male who presents for the following: folliculitis  Patient present today for follow up visit for follow up. Patient was last evaluated on 09/27/22 & 11/22/22 for folliculitis.  Ave Filter, reports being happy with the results of the surgery to remove a cyst and states that the experience was less stressful than anticipated. He mentions that he has not been using any topical treatments since the surgery and has been taking care of the wound. The patient confirms using Aquaphor for wound care and reports that the surgical site has healed well without any complications. He denies any current painful symptoms and expresses satisfaction with the overall progress, including the resolution of hair loss and inflammation. The patient has no questions or concerns at this time.   The following portions of the chart were reviewed this encounter and updated as appropriate: medications, allergies, medical history  Review of Systems:  No other skin or systemic complaints except as noted in HPI or Assessment and Plan.  Objective  Well appearing patient in no apparent distress; mood and affect are within normal limits.     A focused examination was performed of the following areas:   Relevant exam findings are noted in the Assessment and Plan.       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 Folliculitis and History of Cyst Removal - Assessment: Patient reports successful surgery with satisfactory results. The incision site is healing well without signs of infection or inflammation. - Plan: Continue applying Aquaphor on the incision site for at least another month to promote optimal healing. Monitor for any new cyst formation or pimple-like bumps, using clobetasol  solution as needed. - Prescribe doxycycline 100 mg, one tablet twice a day for two weeks as needed for flare-ups of deep cysts. Instruct the patient to keep the medication on hand in case of a flare-up.  3. Scar Monitoring and Potential Keloid Formation - Assessment: The incision site is healing well, but there is a need to monitor for keloid or hypertrophic scar formation. - Plan: If the scar starts to keloid or become thick and high, schedule an appointment with Dr. Langston Reusing or Dr. Caralyn Guile for potential Kenalog treatment. Continue using Aquaphor on the incision site to promote optimal healing and prevent keloid formation.  4. Follow-Up Appointment - Plan: Schedule a follow-up appointment in six months to ensure proper healing and monitor for any changes in the patient's condition.    Folliculitis  Related Medications doxycycline (VIBRA-TABS) 100 MG tablet Take 1 tablet (100 mg total) by mouth daily.  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    Return in about 6 months (around 06/27/2023) for folliculitis.    Documentation: I have reviewed the above documentation for accuracy and completeness, and I agree with the above.   I, Shirron Marcha Solders, CMA, am acting as scribe for [employer], DO.   Langston Reusing, DO

## 2022-12-28 NOTE — Patient Instructions (Addendum)
Hello Stephen Bruce,  Thank you for visiting [place] today. Your dedication to your post-surgery recovery is commendable, and we are encouraged by the progress you've made.  Here is a summary of the key instructions from today's consultation with Dr. Langston Reusing:  - Aquaphor Application: Continue applying Aquaphor on the incision site for at least another month to promote optimal healing.  - Doxycycline Prescription:   - New Prescription: We will send a new prescription for doxycycline 100 mg.   - Dosage: Take one tablet twice a day for two weeks, as needed, if you feel a new cyst forming.  - Clobetasol Solution: Apply this solution to any pimple-like bumps that may appear.  - Follow-Up Appointment:   - Schedule: We will see you in six months to monitor your healing process.   - Immediate Attention: If the scar begins to keloid or protrude, please schedule an immediate appointment with either Dr. Onalee Hua or Dr. Rosanne Gutting for potential treatment with Kenalog injections.  - Keeping Doxycycline on Hand: It is crucial to have doxycycline readily available, especially during times when the clinic is closed.  We are here to support you throughout your recovery journey. Should you have any questions or if your condition changes, please do not hesitate to contact our office.  Wishing you continued health and wellness.  Warm regards,  Dr. [facility] Reusing [facility]  Important Information  Due to recent changes in healthcare laws, you may see results of your pathology and/or laboratory studies on MyChart before the doctors have had a chance to review them. We understand that in some cases there may be results that are confusing or concerning to you. Please understand that not all results are received at the same time and often the doctors may need to interpret multiple results in order to provide you with the best plan of care or course of treatment. Therefore, we ask that you please give [place] 2 business days to  thoroughly review all your results before contacting the office for clarification. Should we see a critical lab result, you will be contacted sooner.   If You Need Anything After Your Visit  If you have any questions or concerns for your doctor, please call our main line at [phone] If no one answers, please leave a voicemail as directed and we will return your call as soon as possible. Messages left after 4 pm will be answered the following business day.   You may also send [place] a message via MyChart. We typically respond to MyChart messages within 1-2 business days.  For prescription refills, please ask your pharmacy to contact our office. Our fax number is [phone].  If you have an urgent issue when the clinic is closed that cannot wait until the next business day, you can page your doctor at the number below.    Please note that while we do our best to be available for urgent issues outside of office hours, we are not available 24/7.   If you have an urgent issue and are unable to reach [place], you may choose to seek medical care at your doctor's office, retail clinic, urgent care center, or emergency room.  If you have a medical emergency, please immediately call 911 or go to the emergency department. In the event of inclement weather, please call our main line at [phone] for an update on the status of any delays or closures.  Dermatology Medication Tips: Please keep the boxes that topical medications come in in order to help  keep track of the instructions about where and how to use these. Pharmacies typically print the medication instructions only on the boxes and not directly on the medication tubes.   If your medication is too expensive, please contact our office at [phone] or send [place] a message through MyChart.   We are unable to tell what your co-pay for medications will be in advance as this is different depending on your insurance coverage. However, we may be able to  find a substitute medication at lower cost or fill out paperwork to get insurance to cover a needed medication.   If a prior authorization is required to get your medication covered by your insurance company, please allow [place] 1-2 business days to complete this process.  Drug prices often vary depending on where the prescription is filled and some pharmacies may offer cheaper prices.  The website www.goodrx.com contains coupons for medications through different pharmacies. The prices here do not account for what the cost may be with help from insurance (it may be cheaper with your insurance), but the website can give you the price if you did not use any insurance.  - You can print the associated coupon and take it with your prescription to the pharmacy.  - You may also stop by our office during regular business hours and pick up a GoodRx coupon card.  - If you need your prescription sent electronically to a different pharmacy, notify our office through [facility] or by phone at [phone]

## 2022-12-28 NOTE — Progress Notes (Signed)
   Follow-Up Visit   Subjective  Stephen Bruce is a 22 y.o. male who presents for the following: Post op Cyst of right post scalp, healing well.   The following portions of the chart were reviewed this encounter and updated as appropriate: medications, allergies, medical history  Review of Systems:  No other skin or systemic complaints except as noted in HPI or Assessment and Plan.  Objective  Well appearing patient in no apparent distress; mood and affect are within normal limits.  A focused examination was performed of the following areas: Scalp   Relevant exam findings are noted in the Assessment and Plan.      Assessment & Plan   Encounter for Removal of Sutures - Incision site at the right post scalp is clean, dry and intact - Wound cleansed, staples removed - Discussed pathology results showing cyst  - Scars remodel for a full year. - Patient advised to call with any concerns or if they notice any new or changing lesions.    Return if symptoms worsen or fail to improve.  I, Joanie Coddington, CMA, am acting as scribe for Gwenith Daily, MD .   Documentation: I have reviewed the above documentation for accuracy and completeness, and I agree with the above.  Gwenith Daily, MD

## 2023-01-01 DIAGNOSIS — G4733 Obstructive sleep apnea (adult) (pediatric): Secondary | ICD-10-CM | POA: Diagnosis not present

## 2023-03-04 ENCOUNTER — Other Ambulatory Visit: Payer: Self-pay

## 2023-03-04 ENCOUNTER — Encounter (HOSPITAL_COMMUNITY): Payer: Self-pay

## 2023-03-04 ENCOUNTER — Ambulatory Visit (HOSPITAL_COMMUNITY)
Admission: RE | Admit: 2023-03-04 | Discharge: 2023-03-04 | Disposition: A | Discharge status: Home / Self Care | Payer: Medicaid Other | Source: Ambulatory Visit | Attending: Internal Medicine | Admitting: Internal Medicine

## 2023-03-04 VITALS — BP 116/77 | HR 98 | Temp 99.2°F | Resp 18

## 2023-03-04 DIAGNOSIS — J069 Acute upper respiratory infection, unspecified: Secondary | ICD-10-CM | POA: Insufficient documentation

## 2023-03-04 LAB — BASIC METABOLIC PANEL
Anion gap: 13 (ref 5–15)
BUN: 10 mg/dL (ref 6–20)
CO2: 23 mmol/L (ref 22–32)
Calcium: 9.1 mg/dL (ref 8.9–10.3)
Chloride: 101 mmol/L (ref 98–111)
Creatinine, Ser: 1.04 mg/dL (ref 0.61–1.24)
GFR, Estimated: >60 mL/min (ref >60)
Glucose, Bld: 67 mg/dL — ABNORMAL LOW (ref 70–99)
Potassium: 3.9 mmol/L (ref 3.5–5.1)
Sodium: 137 mmol/L (ref 135–145)

## 2023-03-04 MED ORDER — FLUTICASONE PROPIONATE 50 MCG/ACT NA SUSP: 2.0000 | Freq: Every day | NASAL | 2 refills | Status: AC

## 2023-03-04 MED ORDER — PREDNISONE 10 MG PO TABS: 40.0000 mg | ORAL_TABLET | Freq: Every day | ORAL | 0 refills | Status: AC

## 2023-03-04 MED ORDER — PROMETHAZINE-DM 6.25-15 MG/5ML PO SYRP: 5.0000 mL | ORAL_SOLUTION | Freq: Three times a day (TID) | ORAL | 0 refills | Status: AC | PRN

## 2023-03-04 NOTE — ED Notes (Signed)
 Reviewed work note details

## 2023-03-04 NOTE — Discharge Instructions (Addendum)
 Symptoms most consistent with a viral upper respiratory infection.  Given the recent exposure to COVID we will test you today.  This test will take approximately 12 hours to get results.  In the interim we will treat as follows: Promethazine  DM 5 mL every 8 hours as needed for cough.  Use caution as this medication can cause drowsiness. Prednisone  40 mg daily for 5 days. Take this in the morning.  This is a steroid and can help with inflammation. Flonase  2 sprays each nostril once daily for 10 days.  This helps with nasal congestion We will contact you with the results of the Covid test and if positive, can consider starting on Paxlovid. We will order a BMP as this will be needed if we need to start Paxlovid. Avoid close contact with others while you are running a fever.  Return to urgent care or PCP if symptoms worsen or fail to resolve.

## 2023-03-04 NOTE — ED Triage Notes (Signed)
 Reports cough started yesterday.  Also feeling tired, runny nose, sneezing.   Has had delsym and dayquil

## 2023-03-04 NOTE — ED Provider Notes (Signed)
 [facility]    CSN: [id number] Arrival date & time: 03/04/23  1456      History   Chief Complaint Chief Complaint  Patient presents with   Appointment    3:00    HPI Stephen Bruce is a 23 y.o. male.   23 year old male who presents to urgent care with complaints of fatigue, runny nose, sneezing, cough and fevers.  The symptoms have been going on for about 3 to 4 days.  The patient's family member reports that she has been diagnosed with COVID and is around him all the time.  He is eating and drinking well.  He denies nausea, vomiting, abdominal pain, dysuria, hematuria, shortness of breath or chest pain.     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   Learning disability 07/22/2015   Fatigue 11/03/2014   Hypersomnia 11/03/2014   Obstructive sleep apnea (adult) (pediatric) 11/03/2014   Delayed milestones 10/18/2010   Speech delays 10/18/2010    Past Surgical History:  Procedure Laterality Date   ACHILLES TENDON SURGERY Right 08/25/2013   Procedure: TENDON ACHILLES LENGTH RIGHT FOOT;  Surgeon: Charlie Failing, DPM;  Location: [facility];  Service: Podiatry;  Laterality: Right;   ACHILLES TENDON SURGERY Left 02/04/2014   Procedure: TENDO ACHILLES LENGTH;  Surgeon: Charlie Failing, DPM;  Location: [facility];  Service: Podiatry;  Laterality: Left;   INCISION AND DRAINAGE ABSCESS ANAL   01/15/2017       SUBTALAR JOINT ARTHROEREISIS Right 08/25/2013   Procedure: SUBTALAR JOINT ARTHROEREISIS RIGHT;  Surgeon: Charlie Failing, DPM;  Location: [facility];  Service: Podiatry;  Laterality: Right;   SUBTALAR JOINT ARTHROEREISIS Left 02/04/2014   Procedure: SUBTALAR  ARTHROEREISIS LEFT FOOT ;  Surgeon: Charlie Failing, DPM;  Location: [facility];  Service: Podiatry;  Laterality: Left;   TONSILLECTOMY AND ADENOIDECTOMY         Home Medications    Prior to Admission medications   Medication Sig Start Date End Date Taking? Authorizing Provider  fluticasone  (FLONASE ) 50 MCG/ACT nasal spray Place 2 sprays into both nostrils daily. 03/04/23  Yes Kacy Conely A, PA-C  predniSONE  (DELTASONE ) 10 MG tablet Take 4 tablets (40 mg total) by mouth daily with breakfast for 5 days. 03/04/23 03/09/23 Yes Genova Kiner A, PA-C  promethazine -dextromethorphan (PROMETHAZINE -DM) 6.25-15 MG/5ML syrup Take 5 mLs by mouth every 8 (eight) hours as needed for cough. 03/04/23  Yes Mckay Brandt A, PA-C  clindamycin  (CLEOCIN -T) 1 % lotion Apply topically as directed. Apply to scalp and face every other night mixed with tretinoin  08/10/22 08/10/23  Alm Delon SAILOR, DO  clobetasol  (TEMOVATE ) 0.05 % external solution Apply 1 Application topically 2 (two) times daily. Apply to affected areas of scalp twice daily x 2-3 weeks as needed for flares 11/22/22   Alm Delon SAILOR, DO  doxycycline  (VIBRA -TABS) 100 MG tablet Take 1 tablet (100 mg total) by mouth daily. Patient  not taking: Reported on 03/04/2023 12/28/22 03/28/23  Alm Delon SAILOR, DO  ibuprofen  (ADVIL ) 600 MG tablet Take 1 tablet (600 mg total) by mouth every 6 (six) hours as needed. 01/31/22   Rising, Asberry, PA-C  loratadine  (CLARITIN ) 10 MG tablet TAKE 1 TABLET (10 MG TOTAL) BY MOUTH DAILY. 06/30/16   Iva Marty Saltness, MD  meloxicam  (MOBIC ) 15 MG tablet Take 1 tablet (15 mg total) by mouth daily. 11/16/22   Hyatt, Max T,  DPM  methylPREDNISolone  (MEDROL  DOSEPAK) 4 MG TBPK tablet 6 day dose pack - take as directed Patient not taking: Reported on 03/04/2023 11/16/22   Verta Royden DASEN, DPM  Misc. Devices MISC New cpap machine on current settings and supplies.  DME: [employer] 12/25/19   [provider]  Vitamin D , Ergocalciferol , (DRISDOL ) 1.25 MG (50000 UNIT) CAPS capsule Take 1 capsule (50,000 Units total) by mouth 2 (two) times a week. 05/25/22   Georgina Speaks, FNP    Family History Family History  Problem Relation Age of Onset   Diabetes Father    Hypertension Father    Asthma Father     Social History Social History   Tobacco Use   Smoking status: Never   Smokeless tobacco: Never  Vaping Use   Vaping status: Never Used  Substance Use Topics   Alcohol use: No   Drug use: Never     Allergies   Bactrim  [sulfamethoxazole -trimethoprim ], Shellfish allergy , and Shellfish-derived products   Review of Systems Review of Systems  Constitutional:  Positive for fever. Negative for chills.  HENT:  Positive for congestion and sore throat. Negative for ear pain.   Eyes:  Negative for pain and visual disturbance.  Respiratory:  Positive for cough. Negative for shortness of breath.   Cardiovascular:  Negative for chest pain and palpitations.  Gastrointestinal:  Negative for abdominal pain and vomiting.  Genitourinary:  Negative for dysuria and hematuria.  Musculoskeletal:  Negative for arthralgias and back pain.  Skin:  Negative for color change and rash.  Neurological:  Negative for seizures and syncope.  All other systems reviewed and are negative.    Physical Exam Triage Vital Signs ED Triage Vitals  Encounter Vitals Group     BP 03/04/23 1542 116/77     Systolic BP Percentile --      Diastolic BP Percentile --      Pulse Rate 03/04/23 1542 98     Resp 03/04/23 1542 18     Temp 03/04/23 1542 99.2 F (37.3 C)     Temp Source 03/04/23 1542 Oral     SpO2 03/04/23 1542 95 %     Weight  --      Height --      Head Circumference --      Peak Flow --      Pain Score 03/04/23 1539 8     Pain Loc --      Pain Education --      Exclude from Growth Chart --    No data found.  Updated Vital Signs BP 116/77 (BP Location: Left Arm)   Pulse 98   Temp 99.2 F (37.3 C) (Oral)   Resp 18   SpO2 95%   Visual Acuity Right Eye Distance:   Left Eye Distance:   Bilateral Distance:    Right Eye Near:   Left Eye Near:    Bilateral Near:     Physical Exam Vitals and nursing note reviewed.  Constitutional:  General: He is not in acute distress.    Appearance: Normal appearance. He is well-developed.  HENT:     Head: Normocephalic and atraumatic.     Right Ear: Tympanic membrane normal.     Left Ear: Tympanic membrane normal.     Nose: Nose normal.     Mouth/Throat:     Mouth: Mucous membranes are moist.  Eyes:     Conjunctiva/sclera: Conjunctivae normal.  Cardiovascular:     Rate and Rhythm: Normal rate and regular rhythm.     Heart sounds: No murmur heard. Pulmonary:     Effort: Pulmonary effort is normal. No respiratory distress.     Breath sounds: Normal breath sounds.  Abdominal:     Palpations: Abdomen is soft.     Tenderness: There is no abdominal tenderness.  Musculoskeletal:        General: No swelling.     Cervical back: Neck supple.  Skin:    General: Skin is warm and dry.     Capillary Refill: Capillary refill takes less than 2 seconds.  Neurological:     Mental Status: He is alert.  Psychiatric:        Mood and Affect: Mood normal.      UC Treatments / Results  Labs (all labs ordered are listed, but only abnormal results are displayed) Labs Reviewed  SARS CORONAVIRUS 2 (TAT 6-24 HRS)  BASIC METABOLIC PANEL    EKG   Radiology No results found.  Procedures Procedures (including critical care time)  Medications Ordered in UC Medications - No data to display  Initial Impression / Assessment and Plan / UC Course  I have  reviewed the triage vital signs and the nursing notes.  Pertinent labs & imaging results that were available during my care of the patient were reviewed by me and considered in my medical decision making (see chart for details).     Viral upper respiratory tract infection with cough   Symptoms most consistent with a viral upper respiratory infection.  Given the recent exposure to COVID we will test you today.  This test will take approximately 12 hours to get results.  In the interim we will treat as follows: Promethazine  DM 5 mL every 8 hours as needed for cough.  Use caution as this medication can cause drowsiness. Prednisone  40 mg daily for 5 days. Take this in the morning.  This is a steroid and can help with inflammation. Flonase  2 sprays each nostril once daily for 10 days.  This helps with nasal congestion We will contact you with the results of the Covid test and if positive, can consider starting on Paxlovid. . Avoid close contact with others while you are running a fever.  Return to urgent care or PCP if symptoms worsen or fail to resolve.    Final Clinical Impressions(s) / UC Diagnoses   Final diagnoses:  Viral upper respiratory tract infection with cough     Discharge Instructions      Symptoms most consistent with a viral upper respiratory infection.  Given the recent exposure to COVID we will test you today.  This test will take approximately 12 hours to get results.  In the interim we will treat as follows: Promethazine  DM 5 mL every 8 hours as needed for cough.  Use caution as this medication can cause drowsiness. Prednisone  40 mg daily for 5 days. Take this in the morning.  This is a steroid and can help with inflammation. Flonase  2 sprays each  nostril once daily for 10 days.  This helps with nasal congestion We will contact you with the results of the Covid test and if positive, can consider starting on Paxlovid. . Avoid close contact with others while you are running  a fever.  Return to urgent care or PCP if symptoms worsen or fail to resolve.       ED Prescriptions     Medication Sig Dispense Auth. Provider   predniSONE  (DELTASONE ) 10 MG tablet Take 4 tablets (40 mg total) by mouth daily with breakfast for 5 days. 20 tablet Cyara Devoto A, PA-C   promethazine -dextromethorphan (PROMETHAZINE -DM) 6.25-15 MG/5ML syrup Take 5 mLs by mouth every 8 (eight) hours as needed for cough. 180 mL Giavonni Cizek A, PA-C   fluticasone  (FLONASE ) 50 MCG/ACT nasal spray Place 2 sprays into both nostrils daily. 9.9 mL Teresa Almarie LABOR, [place]      PDMP not reviewed this encounter.   Teresa Almarie LABOR, [place] 03/04/23 1620

## 2023-03-05 LAB — SARS CORONAVIRUS 2 (TAT 6-24 HRS): SARS Coronavirus 2: POSITIVE — AB

## 2023-03-22 ENCOUNTER — Ambulatory Visit: Payer: Medicaid Other | Admitting: Dermatology

## 2023-04-26 DIAGNOSIS — G4733 Obstructive sleep apnea (adult) (pediatric): Secondary | ICD-10-CM | POA: Diagnosis not present

## 2023-04-26 DIAGNOSIS — G4710 Hypersomnia, unspecified: Secondary | ICD-10-CM | POA: Diagnosis not present

## 2023-05-03 DIAGNOSIS — G4733 Obstructive sleep apnea (adult) (pediatric): Secondary | ICD-10-CM | POA: Diagnosis not present

## 2023-06-06 ENCOUNTER — Encounter: Payer: Self-pay | Admitting: Dermatology

## 2023-06-06 ENCOUNTER — Ambulatory Visit (INDEPENDENT_AMBULATORY_CARE_PROVIDER_SITE_OTHER): Admitting: Dermatology

## 2023-06-06 VITALS — BP 122/71 | HR 77

## 2023-06-06 DIAGNOSIS — L738 Other specified follicular disorders: Secondary | ICD-10-CM

## 2023-06-06 DIAGNOSIS — L03811 Cellulitis of head [any part, except face]: Secondary | ICD-10-CM

## 2023-06-06 DIAGNOSIS — L905 Scar conditions and fibrosis of skin: Secondary | ICD-10-CM | POA: Diagnosis not present

## 2023-06-06 MED ORDER — CLINDAMYCIN PHOSPHATE 1 % EX SOLN
Freq: Every day | CUTANEOUS | 0 refills | Status: DC
Start: 2023-06-06 — End: 2023-07-16

## 2023-06-06 MED ORDER — DOXYCYCLINE HYCLATE 100 MG PO TABS
100.0000 mg | ORAL_TABLET | Freq: Every day | ORAL | 3 refills | Status: DC
Start: 2023-06-06 — End: 2023-12-26

## 2023-06-06 NOTE — Patient Instructions (Addendum)
 Hibaclens to wash his head to help with these spots Important Information  Due to recent changes in healthcare laws, you may see results of your pathology and/or laboratory studies on MyChart before the doctors have had a chance to review them. We understand that in some cases there may be results that are confusing or concerning to you. Please understand that not all results are received at the same time and often the doctors may need to interpret multiple results in order to provide you with the best plan of care or course of treatment. Therefore, we ask that you please give [place] 2 business days to thoroughly review all your results before contacting the office for clarification. Should we see a critical lab result, you will be contacted sooner.   If You Need Anything After Your Visit  If you have any questions or concerns for your doctor, please call our main line at [phone] If no one answers, please leave a voicemail as directed and we will return your call as soon as possible. Messages left after 4 pm will be answered the following business day.   You may also send [place] a message via MyChart. We typically respond to MyChart messages within 1-2 business days.  For prescription refills, please ask your pharmacy to contact our office. Our fax number is [phone].  If you have an urgent issue when the clinic is closed that cannot wait until the next business day, you can page your doctor at the number below.    Please note that while we do our best to be available for urgent issues outside of office hours, we are not available 24/7.   If you have an urgent issue and are unable to reach [place], you may choose to seek medical care at your doctor's office, retail clinic, urgent care center, or emergency room.  If you have a medical emergency, please immediately call 911 or go to the emergency department. In the event of inclement weather, please call our main line at [phone] for an update on  the status of any delays or closures.  Dermatology Medication Tips: Please keep the boxes that topical medications come in in order to help keep track of the instructions about where and how to use these. Pharmacies typically print the medication instructions only on the boxes and not directly on the medication tubes.   If your medication is too expensive, please contact our office at [phone] or send [place] a message through MyChart.   We are unable to tell what your co-pay for medications will be in advance as this is different depending on your insurance coverage. However, we may be able to find a substitute medication at lower cost or fill out paperwork to get insurance to cover a needed medication.   If a prior authorization is required to get your medication covered by your insurance company, please allow [place] 1-2 business days to complete this process.  Drug prices often vary depending on where the prescription is filled and some pharmacies may offer cheaper prices.  The website www.goodrx.com contains coupons for medications through different pharmacies. The prices here do not account for what the cost may be with help from insurance (it may be cheaper with your insurance), but the website can give you the price if you did not use any insurance.  - You can print the associated coupon and take it with your prescription to the pharmacy.  - You may also stop by our office during regular business hours  and pick up a GoodRx coupon card.  - If you need your prescription sent electronically to a different pharmacy, notify our office through [facility] or by phone at [phone]    Skin Education :   I counseled the patient regarding the following: Sun screen (SPF 30 or greater) should be applied during peak UV exposure (between 10am and 2pm) and reapplied after exercise or swimming.  The ABCDEs of melanoma were reviewed with the patient, and the importance of monthly  self-examination of moles was emphasized. Should any moles change in shape or color, or itch, bleed or burn, pt will contact our office for evaluation sooner then their interval appointment.  Plan: Sunscreen Recommendations I recommended a broad spectrum sunscreen with a SPF of 30 or higher. I explained that SPF 30 sunscreens block approximately 97 percent of the sun's harmful rays. Sunscreens should be applied at least 15 minutes prior to expected sun exposure and then every 2 hours after that as long as sun exposure continues. If swimming or exercising sunscreen should be reapplied every 45 minutes to an hour after getting wet or sweating. One ounce, or the equivalent of a shot glass full of sunscreen, is adequate to protect the skin not covered by a bathing suit. I also recommended a lip balm with a sunscreen as well. Sun protective clothing can be used in lieu of sunscreen but must be worn the entire time you are exposed to the sun's rays.

## 2023-06-06 NOTE — Progress Notes (Unsigned)
   New Patient Visit   Subjective  Stephen Bruce is a 23 y.o. male who presents for the following: Consult to remove cyst.  The patient has spots, moles and lesions to be evaluated, some may be new or changing and the patient may have concern these could be cancer.   The following portions of the chart were reviewed this encounter and updated as appropriate: medications, allergies, medical history  Review of Systems:  No other skin or systemic complaints except as noted in HPI or Assessment and Plan.  Objective  Well appearing patient in no apparent distress; mood and affect are within normal limits.    A focused examination was performed of the following areas: scalp  Relevant exam findings are noted in the Assessment and Plan.    Assessment & Plan     FOLLICULAR OCCLUSION TETRAD Scalp clindamycin (CLEOCIN T) 1 % external solution - Scalp Apply topically daily.  doxycycline (VIBRA-TABS) 100 MG tablet - Scalp Take 1 tablet (100 mg total) by mouth daily.  No follow-ups on file.  I, Haig Levan, Surg Tech III, am acting as scribe for Deneise Finlay, MD.   Documentation: I have reviewed the above documentation for accuracy and completeness, and I agree with the above.  Deneise Finlay, MD

## 2023-06-27 ENCOUNTER — Ambulatory Visit (INDEPENDENT_AMBULATORY_CARE_PROVIDER_SITE_OTHER): Payer: Medicaid Other | Admitting: Dermatology

## 2023-06-27 ENCOUNTER — Encounter: Payer: Self-pay | Admitting: Dermatology

## 2023-06-27 VITALS — BP 111/71 | HR 74 | Wt 175.0 lb

## 2023-06-27 DIAGNOSIS — L662 Folliculitis decalvans: Secondary | ICD-10-CM | POA: Diagnosis not present

## 2023-06-27 DIAGNOSIS — Z139 Encounter for screening, unspecified: Secondary | ICD-10-CM

## 2023-06-27 DIAGNOSIS — Z7189 Other specified counseling: Secondary | ICD-10-CM | POA: Diagnosis not present

## 2023-06-27 NOTE — Progress Notes (Signed)
 Follow-Up Visit   Subjective  Stephen Bruce is a 23 y.o. male accompanied by mom (Stephen Bruce) who presents for the following: Folliculitis  Patient present today for follow up visit for Folliculitis. Patient was last evaluated on 06/06/23. At this visit patient was prescribed doxycycline  100 mg daily and topical Clindamycin  lotion after washing with Hibacleanse 2 times weekly. Patient reports sxs are improved but not at goal. Patient denies medication changes.  Stephen Bruce, a male patient with a history of acne, presents for an unscheduled follow-up visit due to a recent flare-up of his condition. The patient was last seen in November and had been taking doxycycline  daily from November until March. Upon discontinuation of the medication, he experienced a flare of his acne.  Following the flare, [name] resumed doxycycline  as prescribed by Dr. Pasi. He has also been using Hibiclens  soap and applying clindamycin  as part of his current treatment regimen. The patient reports that this combination of treatments is helping to manage his acne. [name] is currently taking 100 mg of doxycycline  daily.  The patient's acne appears to be an ongoing issue, with periods of improvement and exacerbation. The recent flare occurred shortly after stopping the doxycycline , suggesting a reliance on the medication for symptom control. [name]'s acne seems to respond positively to the current treatment plan, which includes oral antibiotics and topical treatments.  The following portions of the chart were reviewed this encounter and updated as appropriate: medications, allergies, medical history  Review of Systems:  No other skin or systemic complaints except as noted in HPI or Assessment and Plan.  Objective  Well appearing patient in no apparent distress; mood and affect are within normal limits.  A focused examination was performed of the following areas: Scalp  Relevant exam findings are noted in the Assessment  and Plan.         Assessment & Plan     1. Folliculitis Stephen Bruce - Assessment: Patient has been on doxycycline  100 mg daily from November to March, which was effective in controlling acne. Upon discontinuation, patient experienced a flare. Currently using Hibiclens  soap, applying clindamycin , and taking doxycycline  as prescribed by Dr. Paci. Given the chronic nature of the condition and the need for long-term antibiotic use, alternative treatment options were discussed, including isotretinoin (Accutane) and adalimumab (Humira). Patient expressed interest in trying isotretinoin. - Plan:    Decrease doxycycline  to 50 mg PO daily to avoid antibiotic resistance    Initiate isotretinoin (Accutane) therapy:     - Obtain baseline fasting laboratory tests this week     - Register patient in iPledge program     - Prescribe isotretinoin through specialty pharmacy Stephen Bruce) once lab results are available     - Patient to start medication next week, taken with dinner     - Informed consent obtained; discussed potential side effects including dryness, dry lips, dry skin, joint pain, stomach pain, headaches, and mood changes     - Advised against alcohol consumption and blood donation while on treatment    Continue current regimen (Hibiclens  soap, clindamycin  application, doxycycline ) until isotretinoin therapy begins    Follow-up appointment in 5 weeks    Monthly follow-up visits thereafter for the duration of isotretinoin treatment (approximately 6 months)    Goal: Discontinue doxycycline  after successful completion of isotretinoin course  Isotretinoin Counseling; Review and Contraception Counseling: Reviewed potential side effects of isotretinoin including xerosis, cheilitis, hepatitis, hyperlipidemia, and severe birth defects if taken by a pregnant woman.  Women on  isotretinoin must be celibate (not having sex) or required to use at least 2 birth control methods to prevent pregnancy (unless patient  is a male of non-child bearing potential).  Females of child-bearing potential must have monthly pregnancy tests while on isotretinoin and report through I-Pledge (FDA monitoring program). Reviewed reports of suicidal ideation in those with a history of depression while taking isotretinoin and reports of diagnosis of inflammatory bowl disease (IBD) while taking isotretinoin as well as the lack of evidence for a causal relationship between isotretinoin, depression and IBD. Patient advised to reach out with any questions or concerns. Patient advised not to share pills or donate blood while on treatment or for one month after completing treatment. All patient's considering Isotretinoin must read and understand and sign Isotretinoin Consent Form and be registered with I-Pledge.      No follow-ups on file.  I, Stephen Bruce, am acting as Neurosurgeon for [employer], DO.  Documentation: I have reviewed the above documentation for accuracy and completeness, and I agree with the above.  Stephen Roup, DO

## 2023-06-27 NOTE — Patient Instructions (Signed)

## 2023-06-30 LAB — COMPREHENSIVE METABOLIC PANEL WITH GFR
ALT: 10 IU/L (ref 0–44)
AST: 19 IU/L (ref 0–40)
Albumin: 4.8 g/dL (ref 4.3–5.2)
Alkaline Phosphatase: 69 IU/L (ref 44–121)
BUN/Creatinine Ratio: 10 (ref 9–20)
BUN: 9 mg/dL (ref 6–20)
Bilirubin Total: 0.8 mg/dL (ref 0.0–1.2)
CO2: 22 mmol/L (ref 20–29)
Calcium: 9.7 mg/dL (ref 8.7–10.2)
Chloride: 101 mmol/L (ref 96–106)
Creatinine, Ser: 0.94 mg/dL (ref 0.76–1.27)
Globulin, Total: 2.1 g/dL (ref 1.5–4.5)
Glucose: 77 mg/dL (ref 70–99)
Potassium: 4.7 mmol/L (ref 3.5–5.2)
Sodium: 140 mmol/L (ref 134–144)
Total Protein: 6.9 g/dL (ref 6.0–8.5)
eGFR: 118 mL/min/{1.73_m2} (ref >59)

## 2023-06-30 LAB — CBC WITH DIFFERENTIAL/PLATELET
Basophils Absolute: 0 10*3/uL (ref 0.0–0.2)
Basos: 1 %
EOS (ABSOLUTE): 0.1 10*3/uL (ref 0.0–0.4)
Eos: 1 %
Hematocrit: 42.5 % (ref 37.5–51.0)
Hemoglobin: 14 g/dL (ref 13.0–17.7)
Immature Grans (Abs): 0 10*3/uL (ref 0.0–0.1)
Immature Granulocytes: 0 %
Lymphocytes Absolute: 1.4 10*3/uL (ref 0.7–3.1)
Lymphs: 30 %
MCH: 30.4 pg (ref 26.6–33.0)
MCHC: 32.9 g/dL (ref 31.5–35.7)
MCV: 92 fL (ref 79–97)
Monocytes Absolute: 0.5 10*3/uL (ref 0.1–0.9)
Monocytes: 10 %
Neutrophils Absolute: 2.7 10*3/uL (ref 1.4–7.0)
Neutrophils: 58 %
Platelets: 169 10*3/uL (ref 150–450)
RBC: 4.61 x10E6/uL (ref 4.14–5.80)
RDW: 11.9 % (ref 11.6–15.4)
WBC: 4.6 10*3/uL (ref 3.4–10.8)

## 2023-06-30 LAB — LIPID PANEL
Chol/HDL Ratio: 2.8 ratio (ref 0.0–5.0)
Cholesterol, Total: 143 mg/dL (ref 100–199)
HDL: 51 mg/dL (ref >39)
LDL Chol Calc (NIH): 83 mg/dL (ref 0–99)
Triglycerides: 39 mg/dL (ref 0–149)
VLDL Cholesterol Cal: 9 mg/dL (ref 5–40)

## 2023-07-01 DIAGNOSIS — G4733 Obstructive sleep apnea (adult) (pediatric): Secondary | ICD-10-CM | POA: Diagnosis not present

## 2023-07-02 ENCOUNTER — Telehealth: Payer: Self-pay

## 2023-07-02 MED ORDER — ISOTRETINOIN 30 MG PO CAPS: 60.0000 mg | ORAL_CAPSULE | Freq: Every day | ORAL | 0 refills | Status: DC

## 2023-07-02 NOTE — Telephone Encounter (Signed)
 Rx sent to [facility]

## 2023-07-02 NOTE — Telephone Encounter (Signed)
-----   Message from [name] sent at 07/02/2023  3:46 PM EDT ----- Pt's labs were WNL.  He's ok to start isotretinoin.  Please send rx for 60mg  to [facility]

## 2023-07-04 ENCOUNTER — Encounter: Payer: Self-pay | Admitting: Dermatology

## 2023-07-04 ENCOUNTER — Other Ambulatory Visit: Payer: Self-pay

## 2023-07-04 DIAGNOSIS — L729 Follicular cyst of the skin and subcutaneous tissue, unspecified: Secondary | ICD-10-CM

## 2023-07-04 MED ORDER — ISOTRETINOIN 30 MG PO CAPS
60.0000 mg | ORAL_CAPSULE | Freq: Every day | ORAL | 0 refills | Status: DC
Start: 2023-07-04 — End: 2023-12-26

## 2023-07-05 DIAGNOSIS — G4733 Obstructive sleep apnea (adult) (pediatric): Secondary | ICD-10-CM | POA: Diagnosis not present

## 2023-07-14 ENCOUNTER — Other Ambulatory Visit: Payer: Self-pay | Admitting: Dermatology

## 2023-07-14 DIAGNOSIS — L03811 Cellulitis of head [any part, except face]: Secondary | ICD-10-CM

## 2023-07-15 ENCOUNTER — Encounter: Payer: Self-pay | Admitting: Dermatology

## 2023-07-30 DIAGNOSIS — F840 Autistic disorder: Secondary | ICD-10-CM | POA: Diagnosis not present

## 2023-08-02 ENCOUNTER — Ambulatory Visit: Admitting: Dermatology

## 2023-08-02 ENCOUNTER — Encounter: Payer: Self-pay | Admitting: Dermatology

## 2023-08-02 VITALS — BP 108/81

## 2023-08-02 DIAGNOSIS — L649 Androgenic alopecia, unspecified: Secondary | ICD-10-CM | POA: Diagnosis not present

## 2023-08-02 DIAGNOSIS — L700 Acne vulgaris: Secondary | ICD-10-CM | POA: Diagnosis not present

## 2023-08-02 DIAGNOSIS — L853 Xerosis cutis: Secondary | ICD-10-CM | POA: Diagnosis not present

## 2023-08-02 DIAGNOSIS — K130 Diseases of lips: Secondary | ICD-10-CM

## 2023-08-02 DIAGNOSIS — T50995A Adverse effect of other drugs, medicaments and biological substances, initial encounter: Secondary | ICD-10-CM | POA: Diagnosis not present

## 2023-08-02 DIAGNOSIS — L729 Follicular cyst of the skin and subcutaneous tissue, unspecified: Secondary | ICD-10-CM

## 2023-08-02 MED ORDER — SAFETY SEAL MISCELLANEOUS MISC: 5 refills | Status: AC

## 2023-08-02 NOTE — Patient Instructions (Addendum)

## 2023-08-02 NOTE — Progress Notes (Signed)
 Isotretinoin  Follow-Up Visit   Subjective  Stephen Bruce is a 23 y.o. male accompanied by mother who presents for the following: Isotretinoin  follow-up Patient was last evaluated on 06/27/23. At this visit patient was prescribed Accutane  but D/C after 6 days due to side effects effecting his mental state, along with fatigue and urination frequency.  Current Dose 60 mg  Week # 4   Isotretinoin  F/U - 08/02/23 1500       Isotretinoin  Follow Up   iPledge # [id number]    Date 08/02/23      Skin Side Effects   Dry Lips Yes    Nose bleeds No    Dry eyes No    Dry Skin Yes    Sunburn No      Gastrointestinal Side Effects   Nausea No    Diarrhea No    Blood in stool No      Neurological Side Effects   Blurred vision No    Depression Yes    Headache No    Homicidal thoughts No    Mood Changes Yes    Suicidal thoughts No      Constitutional Side Effects   Fatigue Yes      Musculoskeletal Side Effects   Muscle aches No          Side effects: Dry skin, dry lips   The following portions of the chart were reviewed this encounter and updated as appropriate: medications, allergies, medical history  Review of Systems:  No other skin or systemic complaints except as noted in HPI or Assessment and Plan.  Objective  Well appearing patient in no apparent distress; mood and affect are within normal limits.  An examination of the face, neck, chest, and back was performed and relevant findings are noted below.           Assessment & Plan    ACNE VULGARIS Patient is currently on Isotretinoin  requiring FDA mandated monthly evaluations and laboratory monitoring. Condition is currently not to goal (must reach target dose based on weight and also have clear skin for 2 months prior to discontinuation in order to help prevent relapse)    Week # 4 Pharmacy [facility] # [id number] Total mg -  11850 Total mg/kg - 0.75mg /kg  Continue isotretinoin    Patient  confirmed in iPledge and isotretinoin  sent to pharmacy.   Xerosis secondary to isotretinoin  therapy   Cheilitis secondary to isotretinoin  therapy  1. Adverse effects of isotretinoin  - Assessment:  Patient discontinued isotretinoin  after 6 days of use due to experiencing dryness, fatigue, tiredness, and negative, sad thoughts outside of the norm. Dryness persists 6 days after discontinuation, which is expected as the medication remains in the system for 4 to 6 weeks after cessation. Fatigue has resolved. No active cysts observed on examination, only a well-healing scar from previous surgery. 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 Early treatment is recommended to help with hair regrowth.  - Plan:    Prescribe compounded minoxidil-finasteride topical solution     - Apply once daily    Educate on lifelong commitment  to daily use of topical treatment Long term medication management.  Patient is using long term (months to years) prescription medication  to control their dermatologic condition.  These medications require periodic monitoring to evaluate for efficacy and side effects and may require periodic laboratory monitoring.    Follow-up in 30 days.   Documentation: I have reviewed the above documentation for accuracy and completeness, and I agree with the above.   I, Shirron Louanne Roussel, CMA, am acting as scribe for [employer], DO.   [name], DO

## 2023-08-02 NOTE — Progress Notes (Deleted)
   Isotretinoin  Follow-Up Visit   Subjective  Stephen Bruce is a 23 y.o. male who presents for the following: Isotretinoin  follow-up  Week # 4  Flowisotretinoin***  Side effects: Dry skin, dry lips   The following portions of the chart were reviewed this encounter and updated as appropriate: medications, allergies, medical history  Review of Systems:  No other skin or systemic complaints except as noted in HPI or Assessment and Plan.  Objective  Well appearing patient in no apparent distress; mood and affect are within normal limits.  An examination of the face, neck, chest, and back was performed and relevant findings are noted below.     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 Condition is currently not to goal (must reach target dose based on weight and also have clear skin for 2 months prior to discontinuation in order to help prevent relapse)  Exam findings: ***  Week # 4 Pharmacy [facility] # [id number] Total mg -  *** Total mg/kg - ***   Continue isotretinoin    Patient confirmed in iPledge and isotretinoin  sent to pharmacy.   Isotretinoin  Counseling; Review and Contraception Counseling: Reviewed potential side effects of isotretinoin  including xerosis, cheilitis, hepatitis, hyperlipidemia, and severe birth defects if taken by a pregnant woman.  Women on isotretinoin  must be celibate (not having sex) or required to use at least 2 birth control methods to prevent pregnancy (unless patient is a male of non-child bearing potential).  Females of child-bearing potential must have monthly pregnancy tests while on isotretinoin  and report through I-Pledge (FDA monitoring program). Reviewed reports of suicidal ideation in those with a history of depression while taking isotretinoin  and reports of diagnosis of inflammatory bowl disease (IBD) while taking  isotretinoin  as well as the lack of evidence for a causal relationship between isotretinoin , depression and IBD. Patient advised to reach out with any questions or concerns. Patient advised not to share pills or donate blood while on treatment or for one month after completing treatment. All patient's considering Isotretinoin  must read and understand and sign Isotretinoin  Consent Form and be registered with I-Pledge.  Xerosis secondary to isotretinoin  therapy   Cheilitis secondary to isotretinoin  therapy   Long term medication management (isotretinoin )  Patient is using long term (months to years) prescription medication  to control their dermatologic condition.  These medications require periodic monitoring to evaluate for efficacy and side effects and may require periodic laboratory monitoring.  - While taking Isotretinoin  and for 30 days after you finish the medication, do not get pregnant, do not share pills, do not donate blood. Isotretinoin  is best absorbed when taken with a fatty meal. Isotretinoin  can make you sensitive to the sun. Daily careful sun protection including sunscreen SPF 30+ when outdoors is recommended.  Follow-up in 30 days.    Documentation: I have reviewed the above documentation for accuracy and completeness, and I agree with the above.   I, Caelen Higinbotham Louanne Roussel, CMA, am acting as scribe for [employer], DO.   [name], DO

## 2023-08-02 NOTE — Progress Notes (Deleted)
   Follow-Up Visit   Subjective  Stephen Bruce is a 23 y.o. male accompanied by mother who presents for the following: Acne  Patient present today for follow up visit. Patient was last evaluated on 06/27/23. At this visit patient was prescribed Accutane  but reports side effects after taking so patient D/C  The following portions of the chart were reviewed this encounter and updated as appropriate: medications, allergies, medical history  Review of Systems:  No other skin or systemic complaints except as noted in HPI or Assessment and Plan.  Objective  Well appearing patient in no apparent distress; mood and affect are within normal limits.   A focused examination was performed of the following areas: face   Relevant exam findings are noted in the Assessment and Plan.          Assessment & Plan       No follow-ups on file.   Documentation: I have reviewed the above documentation for accuracy and completeness, and I agree with the above.   I, Cova Knieriem Louanne Roussel, CMA, am acting as scribe for [employer], DO.   [name], DO

## 2023-08-15 DIAGNOSIS — F840 Autistic disorder: Secondary | ICD-10-CM | POA: Diagnosis not present

## 2023-08-16 ENCOUNTER — Other Ambulatory Visit: Payer: Self-pay | Admitting: Dermatology

## 2023-08-16 DIAGNOSIS — L03811 Cellulitis of head [any part, except face]: Secondary | ICD-10-CM

## 2023-08-27 DIAGNOSIS — F840 Autistic disorder: Secondary | ICD-10-CM | POA: Diagnosis not present

## 2023-09-13 DIAGNOSIS — F840 Autistic disorder: Secondary | ICD-10-CM | POA: Diagnosis not present

## 2023-09-18 DIAGNOSIS — F840 Autistic disorder: Secondary | ICD-10-CM | POA: Diagnosis not present

## 2023-10-10 DIAGNOSIS — F840 Autistic disorder: Secondary | ICD-10-CM | POA: Diagnosis not present

## 2023-10-30 ENCOUNTER — Ambulatory Visit (INDEPENDENT_AMBULATORY_CARE_PROVIDER_SITE_OTHER): Payer: Medicaid Other | Admitting: Nurse Practitioner

## 2023-10-30 ENCOUNTER — Encounter: Payer: Self-pay | Admitting: Nurse Practitioner

## 2023-10-30 VITALS — BP 120/60 | HR 78 | Temp 98.7°F | Ht 64.0 in | Wt 177.0 lb

## 2023-10-30 DIAGNOSIS — G4733 Obstructive sleep apnea (adult) (pediatric): Secondary | ICD-10-CM | POA: Diagnosis not present

## 2023-10-30 DIAGNOSIS — Z13220 Encounter for screening for lipoid disorders: Secondary | ICD-10-CM

## 2023-10-30 DIAGNOSIS — Z139 Encounter for screening, unspecified: Secondary | ICD-10-CM | POA: Diagnosis not present

## 2023-10-30 DIAGNOSIS — Z2821 Immunization not carried out because of patient refusal: Secondary | ICD-10-CM

## 2023-10-30 DIAGNOSIS — E6609 Other obesity due to excess calories: Secondary | ICD-10-CM

## 2023-10-30 DIAGNOSIS — E559 Vitamin D deficiency, unspecified: Secondary | ICD-10-CM

## 2023-10-30 DIAGNOSIS — F819 Developmental disorder of scholastic skills, unspecified: Secondary | ICD-10-CM | POA: Diagnosis not present

## 2023-10-30 DIAGNOSIS — Z136 Encounter for screening for cardiovascular disorders: Secondary | ICD-10-CM | POA: Diagnosis not present

## 2023-10-30 DIAGNOSIS — F902 Attention-deficit hyperactivity disorder, combined type: Secondary | ICD-10-CM

## 2023-10-30 DIAGNOSIS — Z13228 Encounter for screening for other metabolic disorders: Secondary | ICD-10-CM | POA: Diagnosis not present

## 2023-10-30 DIAGNOSIS — Z0000 Encounter for general adult medical examination without abnormal findings: Secondary | ICD-10-CM | POA: Diagnosis not present

## 2023-10-30 DIAGNOSIS — F79 Unspecified intellectual disabilities: Secondary | ICD-10-CM

## 2023-10-30 DIAGNOSIS — E66811 Obesity, class 1: Secondary | ICD-10-CM | POA: Diagnosis not present

## 2023-10-30 DIAGNOSIS — Z6830 Body mass index (BMI) 30.0-30.9, adult: Secondary | ICD-10-CM

## 2023-10-30 NOTE — Progress Notes (Signed)
 LILLETTE Kristeen JINNY Gladis, CMA,acting as a Neurosurgeon for Stephen Ada, FNP.,have documented all relevant documentation on the behalf of Stephen Ada, FNP,as directed by  Stephen Ada, FNP while in the presence of Stephen Ada, FNP.  Subjective:   Patient ID: Stephen Bruce , male    DOB: 09/22/2000 , 23 y.o.   MRN: [id number]  Chief Complaint  Patient presents with   Annual Exam    Patient presents today for HM, Patient reports compliance with medication. Patient denies any chest pain, SOB, or headaches. Patient has no concerns today.       Here for HM. He is present with his mother.       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 Apply to affected areas of scalp twice daily x 2-3 weeks as needed for flares, Disp: 50 mL, Rfl: 1   doxycycline  (VIBRA -TABS) 100 MG tablet, Take 1 tablet (100 mg total) by mouth daily., Disp: 90 tablet, Rfl: 3   fluticasone  (FLONASE ) 50 MCG/ACT nasal spray, Place 2 sprays into both nostrils daily., Disp: 9.9 mL, Rfl: 2   ibuprofen  (ADVIL ) 600 MG tablet, Take 1 tablet (600 mg total) by mouth every 6 (six) hours as needed., Disp: 30 tablet, Rfl: 0   ISOtretinoin  (ACCUTANE ) 30 MG capsule, Take 2 capsules (60 mg total) by mouth daily., Disp: 60 capsule, Rfl: 0   loratadine  (CLARITIN ) 10 MG tablet, TAKE 1 TABLET (10 MG TOTAL) BY MOUTH DAILY.,  Disp: 30 tablet, Rfl: 0   meloxicam  (MOBIC ) 15 MG tablet, Take 1 tablet (15 mg total) by mouth daily., Disp: 30 tablet, Rfl: 3   Misc. Devices MISC, New cpap machine on current settings and supplies.  DME: [employer], Disp: , Rfl:    promethazine -dextromethorphan (PROMETHAZINE -DM) 6.25-15 MG/5ML syrup, Take 5 mLs by mouth every 8 (eight) hours as needed for cough., Disp: 180 mL, Rfl: 0   Safety Seal Miscellaneous MISC, Medrock Brogaine Solution Minoxidil USP 8% Finasteride USP 0.5% apply to affected areas of the scalp every morning. Apply with cotton ball or Q-tip., Disp: 30 mL, Rfl: 5   Vitamin D , Ergocalciferol , (DRISDOL ) 1.25 MG (50000 UNIT) CAPS capsule, Take 1 capsule (50,000 Units total) by mouth 2 (two) times a week., Disp: 24 capsule, Rfl: 1   Allergies  Allergen Reactions   Bactrim  [Sulfamethoxazole -Trimethoprim ] Rash    Pt broke out in rash and developed high fever and headache   Shellfish Allergy  Other (See Comments)    POSITIVE ON ALLERGY  TESTING   Shellfish-Derived Products Other (See Comments)    POSITIVE ON ALLERGY  TESTING     Men's preventive visit. Patient Health Questionnaire (PHQ-2) is  Flowsheet Row Office Visit from 10/30/2023 in [facility]  PHQ-2  Total Score 0  Patient is on a Regular diet. Exercising with work.  Marital status: Single. Relevant history for alcohol use is:  Social History   Substance and Sexual Activity  Alcohol Use Never  . Relevant history for tobacco use is:  Social History   Tobacco Use  Smoking Status Never   Passive exposure: Never  Smokeless Tobacco Never  .   Review of Systems  Constitutional: Negative.   HENT: Negative.    Eyes: Negative.   Respiratory: Negative.    Cardiovascular: Negative.   Gastrointestinal: Negative.   Endocrine: Negative.   Genitourinary: Negative.   Musculoskeletal: Negative.   Allergic/Immunologic: Negative.   Neurological: Negative.   Hematological: Negative.    Psychiatric/Behavioral: Negative.       Today's Vitals   10/30/23 1507  BP: 120/60  Pulse: 78  Temp: 98.7 F (37.1 C)  TempSrc: Oral  Weight: 177 lb (80.3 kg)  Height: 5' 4 (1.626 m)  PainSc: 0-No pain   Body mass index is 30.38 kg/m.  Wt Readings from Last 3 Encounters:  10/30/23 177 lb (80.3 kg)  06/27/23 175 lb (79.4 kg)  10/25/22 170 lb (77.1 kg)    Objective:  Physical Exam Vitals and nursing note reviewed.  Constitutional:      Appearance: Normal appearance. He is obese.  HENT:     Head: Normocephalic and atraumatic.     Right Ear: Tympanic membrane, ear canal and external ear normal. There is no impacted cerumen.     Left Ear: Tympanic membrane, ear canal and external ear normal. There is no impacted cerumen.     Nose: Nose normal.  Cardiovascular:     Rate and Rhythm: Normal rate and regular rhythm.     Pulses: Normal pulses.     Heart sounds: Normal heart sounds. No murmur heard. Pulmonary:     Effort: Pulmonary effort is normal. No respiratory distress.     Breath sounds: Normal breath sounds. No wheezing.  Abdominal:     General: Abdomen is flat. Bowel sounds are normal. There is no distension.     Palpations: Abdomen is soft.  Genitourinary:    Comments: Deferred Musculoskeletal:        General: Normal range of motion.     Cervical back: Normal range of motion and neck supple.  Skin:    General: Skin is warm.     Capillary Refill: Capillary refill takes less than 2 seconds.  Neurological:     General: No focal deficit present.     Mental Status: He is alert and oriented to person, place, and time.     Cranial Nerves: No cranial nerve deficit.     Motor: No weakness.  Psychiatric:        Mood and Affect: Mood normal.        Behavior: Behavior normal.        Thought Content: Thought content normal.        Judgment: Judgment normal.     Assessment And Plan:    Encounter for annual health examination -     CBC with  Differential/Platelet  Intellectual disability  Learning disability  ADHD (attention deficit hyperactivity disorder), combined type Assessment & Plan: No current medications, stable.   Vitamin D  deficiency Assessment & Plan: Will check vitamin D  level and supplement as needed.    Also encouraged to spend 15 minutes in the sun daily.    Orders: -     VITAMIN D  25 Hydroxy (Vit-D Deficiency, Fractures)  COVID-19 vaccination declined  Influenza vaccination declined  Class 1 obesity due to excess calories with body mass index (BMI) of 30.0 to 30.9 in adult, unspecified whether serious comorbidity present Assessment & Plan: He is encouraged to strive for BMI less than 30 to decrease cardiac risk. Advised to aim for at least 150 minutes of exercise per week.    OSA on CPAP -     CMP14+EGFR  Encounter for screening for metabolic disorder -     Hemoglobin A1c  Encounter for lipid screening for cardiovascular disease -     Lipid panel  Encounter for screening -     Hepatitis B surface antibody,qualitative     Return for 1 year physical. Patient was given opportunity to ask questions. Patient verbalized understanding of the plan and was able to repeat key elements of the plan. All questions were answered to their satisfaction.   Stephen Ada, FNP  I, Stephen Ada, FNP, have reviewed all documentation for this visit. The documentation on 10/30/23 for the exam, diagnosis, procedures, and orders are all accurate and complete.

## 2023-10-31 LAB — CBC WITH DIFFERENTIAL/PLATELET
Basophils Absolute: 0 x10E3/uL (ref 0.0–0.2)
Basos: 1 %
EOS (ABSOLUTE): 0 x10E3/uL (ref 0.0–0.4)
Eos: 1 %
Hematocrit: 43.2 % (ref 37.5–51.0)
Hemoglobin: 14.1 g/dL (ref 13.0–17.7)
Immature Grans (Abs): 0 x10E3/uL (ref 0.0–0.1)
Immature Granulocytes: 0 %
Lymphocytes Absolute: 1.7 x10E3/uL (ref 0.7–3.1)
Lymphs: 31 %
MCH: 30.6 pg (ref 26.6–33.0)
MCHC: 32.6 g/dL (ref 31.5–35.7)
MCV: 94 fL (ref 79–97)
Monocytes Absolute: 0.7 x10E3/uL (ref 0.1–0.9)
Monocytes: 12 %
Neutrophils Absolute: 3.1 x10E3/uL (ref 1.4–7.0)
Neutrophils: 55 %
Platelets: 152 x10E3/uL (ref 150–450)
RBC: 4.61 x10E6/uL (ref 4.14–5.80)
RDW: 12.1 % (ref 11.6–15.4)
WBC: 5.6 x10E3/uL (ref 3.4–10.8)

## 2023-10-31 LAB — LIPID PANEL
Chol/HDL Ratio: 2.6 ratio (ref 0.0–5.0)
Cholesterol, Total: 144 mg/dL (ref 100–199)
HDL: 55 mg/dL (ref >39)
LDL Chol Calc (NIH): 78 mg/dL (ref 0–99)
Triglycerides: 51 mg/dL (ref 0–149)
VLDL Cholesterol Cal: 11 mg/dL (ref 5–40)

## 2023-10-31 LAB — HEMOGLOBIN A1C
Est. average glucose Bld gHb Est-mCnc: 105 mg/dL
Hgb A1c MFr Bld: 5.3 % (ref 4.8–5.6)

## 2023-10-31 LAB — CMP14+EGFR
ALT: 15 IU/L (ref 0–44)
AST: 21 IU/L (ref 0–40)
Albumin: 4.7 g/dL (ref 4.3–5.2)
Alkaline Phosphatase: 68 IU/L (ref 44–121)
BUN/Creatinine Ratio: 8 — ABNORMAL LOW (ref 9–20)
BUN: 8 mg/dL (ref 6–20)
Bilirubin Total: 0.8 mg/dL (ref 0.0–1.2)
CO2: 27 mmol/L (ref 20–29)
Calcium: 9.7 mg/dL (ref 8.7–10.2)
Chloride: 102 mmol/L (ref 96–106)
Creatinine, Ser: 1.04 mg/dL (ref 0.76–1.27)
Globulin, Total: 2.3 g/dL (ref 1.5–4.5)
Glucose: 65 mg/dL — ABNORMAL LOW (ref 70–99)
Potassium: 4.7 mmol/L (ref 3.5–5.2)
Sodium: 142 mmol/L (ref 134–144)
Total Protein: 7 g/dL (ref 6.0–8.5)
eGFR: 103 mL/min/1.73 (ref >59)

## 2023-10-31 LAB — VITAMIN D 25 HYDROXY (VIT D DEFICIENCY, FRACTURES): Vit D, 25-Hydroxy: 27.5 ng/mL — ABNORMAL LOW (ref 30.0–100.0)

## 2023-10-31 LAB — HEPATITIS B SURFACE ANTIBODY,QUALITATIVE: Hep B Surface Ab, Qual: NONREACTIVE

## 2023-11-01 DIAGNOSIS — F840 Autistic disorder: Secondary | ICD-10-CM | POA: Diagnosis not present

## 2023-11-11 ENCOUNTER — Ambulatory Visit: Payer: Self-pay | Admitting: Nurse Practitioner

## 2023-11-11 DIAGNOSIS — E559 Vitamin D deficiency, unspecified: Secondary | ICD-10-CM

## 2023-11-11 DIAGNOSIS — E66811 Obesity, class 1: Secondary | ICD-10-CM | POA: Insufficient documentation

## 2023-11-11 MED ORDER — VITAMIN D (ERGOCALCIFEROL) 1.25 MG (50000 UNIT) PO CAPS: 50000.0000 [IU] | ORAL_CAPSULE | ORAL | 1 refills | Status: AC

## 2023-11-11 NOTE — Assessment & Plan Note (Signed)
 No current medications, stable.

## 2023-11-11 NOTE — Assessment & Plan Note (Signed)
 Will check vitamin D  level and supplement as needed.    Also encouraged to spend 15 minutes in the sun daily.

## 2023-11-11 NOTE — Assessment & Plan Note (Signed)
 He is encouraged to strive for BMI less than 30 to decrease cardiac risk. Advised to aim for at least 150 minutes of exercise per week.

## 2023-11-13 DIAGNOSIS — F840 Autistic disorder: Secondary | ICD-10-CM | POA: Diagnosis not present

## 2023-12-26 ENCOUNTER — Ambulatory Visit: Admitting: Dermatology

## 2023-12-26 ENCOUNTER — Encounter: Payer: Self-pay | Admitting: Dermatology

## 2023-12-26 DIAGNOSIS — L739 Follicular disorder, unspecified: Secondary | ICD-10-CM | POA: Diagnosis not present

## 2023-12-26 DIAGNOSIS — L649 Androgenic alopecia, unspecified: Secondary | ICD-10-CM | POA: Diagnosis not present

## 2023-12-26 DIAGNOSIS — L03811 Cellulitis of head [any part, except face]: Secondary | ICD-10-CM

## 2023-12-26 MED ORDER — DOXYCYCLINE HYCLATE 100 MG PO TABS: ORAL_TABLET | ORAL | 5 refills | Status: AC

## 2023-12-26 NOTE — Patient Instructions (Signed)

## 2023-12-26 NOTE — Progress Notes (Signed)
   Follow-Up Visit   Subjective  Stephen Bruce is a 23 y.o. male, accompanied by mother, established patient who presents for FOLLOW UP on the diagnoses listed below:  Patient (and/or pt guardian) consented to the use of AI-assisted tools for note generation.  Patient was last evaluated on 08/02/23.   Androgenetic Alopecia: Prescribed Medrock Minoxidil 8%/ Finasteride 0.5% to apply to scalp every morning. Mother stated that she has seen regrowth but it gets to a certain length and breaks off. The topical is only being applied 2-3 days a week when mother remembers. Mother stated the patient has had 2-3 flares of folliculitis since LOV that he started Doxycyline for during those times. However, the bumps presented in areas that she noticed hair regression.    The following portions of the chart were reviewed this encounter and updated as appropriate: medications, allergies, medical history  Review of Systems:  No other skin or systemic complaints except as noted in HPI or Assessment and Plan.  Objective  Well appearing patient in no apparent distress; mood and affect are within normal limits.   A focused examination was performed of the following areas: scalp   Relevant exam findings are noted in the Assessment and Plan.             Assessment & Plan   Androgenic alopecia Hair regrowth observed with current treatment regimen. Hair growth is slow due to application of compound drops only two to three times a week. Inflammation can trigger hair loss, but hair regrows once inflammation subsides. Current regimen shows some vellus hair growth, indicating positive progress. Full regrowth expected in about a year with consistent daily application. Continued use of drops is necessary to maintain hair growth. - Increase application of compound drops to daily or at least every other day. - Use a cotton ball or glove to apply drops to avoid excess runoff. - Sent a new prescription with ten  refills for the compound drops. - Continue using drops indefinitely to maintain hair growth.  Chronic Scalp Folliculitis / Follicular Occlusion  Intermittent flares with recent flare requiring doxycycline  for two weeks. Flares occur approximately twice every six months. Doxycycline  helps control inflammation and reduce hair loss during flares. Clarifying shampoos recommended to manage scalp oiliness, which is influenced by genetics and hormones. - Prescribed doxycycline  for flares, to be taken for 7-10 days as needed. - Use DHS zinc shampoo for inflammation control and clarifying shampoos to manage scalp oiliness. - Alternate between DHS zinc and clarifying shampoos weekly.   Return in 1 year (on 12/25/2024) for ANDROGENETIC ALOPECIA.   Documentation: I have reviewed the above documentation for accuracy and completeness, and I agree with the above.  I, Shirron Maranda, CMA, am acting as scribe for [employer], DO.   Delon Lenis, DO

## 2023-12-31 DIAGNOSIS — G4733 Obstructive sleep apnea (adult) (pediatric): Secondary | ICD-10-CM | POA: Diagnosis not present

## 2024-01-05 DIAGNOSIS — G4733 Obstructive sleep apnea (adult) (pediatric): Secondary | ICD-10-CM | POA: Diagnosis not present

## 2024-02-19 ENCOUNTER — Emergency Department (HOSPITAL_COMMUNITY)

## 2024-02-19 ENCOUNTER — Emergency Department (HOSPITAL_COMMUNITY)
Admission: EM | Admit: 2024-02-19 | Discharge: 2024-02-19 | Disposition: A | Discharge status: Home / Self Care | Attending: Emergency Medicine | Admitting: Emergency Medicine

## 2024-02-19 ENCOUNTER — Encounter (HOSPITAL_COMMUNITY): Payer: Self-pay

## 2024-02-19 ENCOUNTER — Other Ambulatory Visit: Payer: Self-pay

## 2024-02-19 DIAGNOSIS — J101 Influenza due to other identified influenza virus with other respiratory manifestations: Secondary | ICD-10-CM | POA: Diagnosis not present

## 2024-02-19 DIAGNOSIS — J111 Influenza due to unidentified influenza virus with other respiratory manifestations: Secondary | ICD-10-CM

## 2024-02-19 DIAGNOSIS — R509 Fever, unspecified: Secondary | ICD-10-CM | POA: Diagnosis present

## 2024-02-19 MED ORDER — BENZONATATE 100 MG PO CAPS: 200.0000 mg | ORAL_CAPSULE | Freq: Three times a day (TID) | ORAL | 0 refills | Status: AC | PRN

## 2024-02-19 MED ORDER — OSELTAMIVIR PHOSPHATE 75 MG PO CAPS: 75.0000 mg | ORAL_CAPSULE | Freq: Two times a day (BID) | ORAL | 0 refills | Status: AC

## 2024-02-19 MED ORDER — BENZONATATE 100 MG PO CAPS
200.0000 mg | ORAL_CAPSULE | Freq: Once | ORAL | Status: AC
  Administered 2024-02-19: 200 mg via ORAL
  Filled 2024-02-19: qty 2

## 2024-02-19 NOTE — ED Provider Notes (Signed)
 " [place] EMERGENCY DEPARTMENT AT [facility] Provider Note   CSN: [id number] Arrival date & time: 02/19/24  1117     Patient presents with: flu like symptoms   Stephen Bruce is a 23 y.o. male presenting with a 3-day history of flulike symptoms including fever, cough general lysed body aches and clear rhinorrhea.  He has also had a cough which has been productive of a clear sputum.  He denies shortness of breath, chest pain, nausea vomiting or abdominal pain.  He has had a mild frontal headache.  He was exposed to his sister Christmas Day who also has been diagnosed with influenza.  He has taken DayQuil with equivocal symptom relief, last dose was taken yesterday evening.   The history is provided by the patient.       Prior to Admission medications  Medication Sig Start Date End Date Taking? Authorizing Provider  benzonatate  (TESSALON ) 100 MG capsule Take 2 capsules (200 mg total) by mouth 3 (three) times daily as needed. 02/19/24  Yes Emalea Mix, PA-C  oseltamivir  (TAMIFLU ) 75 MG capsule Take 1 capsule (75 mg total) by mouth every 12 (twelve) hours. 02/19/24  Yes Clydine Parkison, Mliss, PA-C  clindamycin  (CLEOCIN  T) 1 % external solution APPLY TO AFFECTED AREA TOPICALLY EVERY DAY 08/16/23   Alm Delon SAILOR, DO  clobetasol  (TEMOVATE ) 0.05 % external solution Apply 1 Application topically 2 (two) times daily. Apply to affected areas of scalp twice daily x 2-3 weeks as needed for flares 11/22/22   Alm Delon SAILOR, DO  doxycycline  (VIBRA -TABS) 100 MG tablet Take BID for 10 days. Use for flares. 12/26/23   Alm Delon SAILOR, DO  fluticasone  (FLONASE ) 50 MCG/ACT nasal spray Place 2 sprays into both nostrils daily. 03/04/23   White, Elizabeth A, PA-C  ibuprofen  (ADVIL ) 600 MG tablet Take 1 tablet (600 mg total) by mouth every 6 (six) hours as needed. 01/31/22   Rising, Rebecca, PA-C  loratadine  (CLARITIN ) 10 MG tablet TAKE 1 TABLET (10 MG TOTAL) BY MOUTH DAILY. 06/30/16   Iva Marty Saltness, MD  meloxicam  (MOBIC ) 15 MG tablet Take 1 tablet (15 mg total) by mouth daily. 11/16/22   Hyatt, Max T, DPM  Misc. Devices MISC New cpap machine on current settings and supplies.  DME: [employer] 12/25/19   [provider]  promethazine -dextromethorphan (PROMETHAZINE -DM) 6.25-15 MG/5ML syrup Take 5 mLs by mouth every 8 (eight) hours as needed for cough. 03/04/23   Teresa Almarie LABOR, PA-C  Safety Seal Miscellaneous MISC [place] Brogaine Solution Minoxidil USP 8% Finasteride USP 0.5% apply to affected areas of the scalp every morning. Apply with cotton ball or Q-tip. 08/02/23   Alm Delon SAILOR, DO  Vitamin D , Ergocalciferol , (DRISDOL ) 1.25 MG (50000 UNIT) CAPS capsule Take 1 capsule (50,000 Units total) by mouth 2 (two) times a week. 11/12/23   Georgina Speaks, FNP    Allergies: Bactrim  [sulfamethoxazole -trimethoprim ], Shellfish allergy , and Shellfish protein-containing drug products    Review of Systems  Constitutional:  Positive for chills and fever.  HENT:  Positive for rhinorrhea. Negative for congestion, ear pain, sinus pressure, sore throat, trouble swallowing and voice change.   Eyes:  Negative for discharge.  Respiratory:  Positive for cough. Negative for shortness of breath, wheezing and stridor.   Cardiovascular:  Negative for chest pain.  Gastrointestinal:  Negative for abdominal pain, nausea and vomiting.  Genitourinary: Negative.   Musculoskeletal:  Positive for myalgias.  Neurological:  Positive for headaches.    Updated Vital Signs  BP 119/71 (BP Location: Right Arm)   Pulse 79   Temp 99.7 F (37.6 C) (Temporal)   Resp 20   Wt 78.1 kg   SpO2 99%   BMI 29.56 kg/m   Physical Exam Constitutional:      Appearance: He is well-developed.  HENT:     Head: Normocephalic and atraumatic.     Right Ear: Tympanic membrane and ear canal normal.     Left Ear: Tympanic membrane and ear canal normal.     Nose: Mucosal edema and rhinorrhea present.     Mouth/Throat:      Mouth: Mucous membranes are moist.     Pharynx: Oropharynx is clear. Uvula midline. No oropharyngeal exudate or posterior oropharyngeal erythema.     Tonsils: No tonsillar abscesses.  Eyes:     Conjunctiva/sclera: Conjunctivae normal.  Cardiovascular:     Rate and Rhythm: Normal rate.     Heart sounds: Normal heart sounds.  Pulmonary:     Effort: Pulmonary effort is normal. No respiratory distress.     Breath sounds: No wheezing or rales.  Abdominal:     Palpations: Abdomen is soft.     Tenderness: There is no abdominal tenderness.  Musculoskeletal:        General: Normal range of motion.  Skin:    General: Skin is warm and dry.     Findings: No rash.  Neurological:     Mental Status: He is alert and oriented to person, place, and time.     (all labs ordered are listed, but only abnormal results are displayed) Labs Reviewed - No data to display  EKG: None  Radiology: DG Chest 2 View Result Date: 02/19/2024 EXAM: 2 VIEW(S) XRAY OF THE CHEST 02/19/2024 01:09:00 PM COMPARISON: 06/16/2010. CLINICAL HISTORY: Shortness of breath with flu-like symptoms. FINDINGS: LUNGS AND PLEURA: No focal pulmonary opacity. No pleural effusion. No pneumothorax. HEART AND MEDIASTINUM: No acute abnormality of the cardiac and mediastinal silhouettes. BONES AND SOFT TISSUES: No acute osseous abnormality. IMPRESSION: 1. No acute process. Electronically signed by: Waddell Calk MD 02/19/2024 02:27 PM EST RP Workstation: [id number]     Procedures   Medications Ordered in the ED  benzonatate  (TESSALON ) capsule 200 mg (200 mg Oral Given 02/19/24 1307)                                    Medical Decision Making Patient presenting with a 3-day history of flulike symptoms with positive known exposure.  He is in no respiratory distress and his vital signs are essentially normal, he does have a temperature 99.7.  Of note, father at bedside states his temperature last night was 105, however he has not  had any antipyretics this morning prior to arrival.  Symptoms are suggestive of influenza.  Department currently has no ability to screen but symptoms strongly suggest this and his exam is otherwise normal with no other sources found for fever and symptoms.  We did get a chest x-ray which is clear, no pneumonia.  He has been treated with Tessalon  for his cough, we discussed the pros and cons of Tamiflu , also discussed that it works best if started within 48 hours, states his symptoms started 2 evenings ago and he would like this medication therefore was prescribed.  Discussed other home treatments including continued fever reduction, rest, increase fluid intake.  Return precautions were outlined.  Patient was stable at time of discharge.  Amount and/or Complexity of Data Reviewed Radiology: ordered.    Details: Chest x-ray reviewed and negative for pneumonia  Risk Prescription drug management.        Final diagnoses:  Influenza    ED Discharge Orders          Ordered    benzonatate  (TESSALON ) 100 MG capsule  3 times daily PRN        02/19/24 1439    oseltamivir  (TAMIFLU ) 75 MG capsule  Every 12 hours        02/19/24 1439               Leann Mayweather, PA-C 02/20/24 1308    Melvenia Motto, MD 02/23/24 1139  "

## 2024-02-19 NOTE — ED Triage Notes (Signed)
 Pt has fever, cough, body aches, x 3 days family member had flu.

## 2024-02-19 NOTE — Discharge Instructions (Signed)
 Your exam, symptoms and your known recent exposure to the flu suggest that this is the source of today's symptoms.  I have prescribed you some medications to help you with your symptoms.  You may continue to use DayQuil as well for symptom relief.  Your chest x-ray is negative for pneumonia.  Plan to get plenty of rest, make sure you are drinking plenty of fluids to avoid dehydration.  Get rechecked for any worsening symptoms including fever that does not respond to Tylenol  or Motrin  or if you develop increased shortness of breath or severe weakness.

## 2024-03-03 ENCOUNTER — Ambulatory Visit: Payer: Self-pay | Admitting: Nurse Practitioner

## 2024-03-25 ENCOUNTER — Ambulatory Visit: Payer: Self-pay

## 2024-03-25 VITALS — BP 122/70 | HR 86 | Temp 98.3°F | Ht 64.0 in | Wt 172.0 lb

## 2024-03-25 DIAGNOSIS — Z23 Encounter for immunization: Secondary | ICD-10-CM | POA: Diagnosis not present

## 2024-10-30 ENCOUNTER — Encounter: Payer: Self-pay | Admitting: Nurse Practitioner

## 2025-01-01 ENCOUNTER — Ambulatory Visit: Admitting: Dermatology
